# Patient Record
Sex: Female | Born: 1975 | Race: Black or African American | Hispanic: No | Marital: Single | Smoking: Former smoker
Health system: Southern US, Community
[De-identification: ages and names within clinical notes are randomized; demographics above are authoritative.]

## PROBLEM LIST (undated history)

## (undated) DIAGNOSIS — K219 Gastro-esophageal reflux disease without esophagitis: Secondary | ICD-10-CM

## (undated) DIAGNOSIS — D649 Anemia, unspecified: Secondary | ICD-10-CM

## (undated) DIAGNOSIS — F419 Anxiety disorder, unspecified: Secondary | ICD-10-CM

## (undated) HISTORY — PX: UMBILICAL HERNIA REPAIR: SHX196

## (undated) HISTORY — DX: Anemia, unspecified: D64.9

## (undated) HISTORY — DX: Gastro-esophageal reflux disease without esophagitis: K21.9

## (undated) HISTORY — PX: TUBAL LIGATION: SHX77

---

## 2007-06-06 ENCOUNTER — Emergency Department (HOSPITAL_COMMUNITY): Admission: EM | Admit: 2007-06-06 | Discharge: 2007-06-06 | Payer: Self-pay | Admitting: Emergency Medicine

## 2007-10-21 ENCOUNTER — Emergency Department (HOSPITAL_COMMUNITY): Admission: EM | Admit: 2007-10-21 | Discharge: 2007-10-21 | Payer: Self-pay | Admitting: Family Medicine

## 2007-11-06 ENCOUNTER — Emergency Department (HOSPITAL_COMMUNITY): Admission: EM | Admit: 2007-11-06 | Discharge: 2007-11-06 | Payer: Self-pay | Admitting: Emergency Medicine

## 2008-06-22 ENCOUNTER — Emergency Department (HOSPITAL_COMMUNITY): Admission: EM | Admit: 2008-06-22 | Discharge: 2008-06-22 | Payer: Self-pay | Admitting: Family Medicine

## 2008-07-19 ENCOUNTER — Emergency Department (HOSPITAL_COMMUNITY): Admission: EM | Admit: 2008-07-19 | Discharge: 2008-07-19 | Payer: Self-pay | Admitting: Family Medicine

## 2009-03-11 ENCOUNTER — Emergency Department (HOSPITAL_COMMUNITY): Admission: EM | Admit: 2009-03-11 | Discharge: 2009-03-11 | Payer: Self-pay | Admitting: Emergency Medicine

## 2010-05-06 ENCOUNTER — Emergency Department (HOSPITAL_COMMUNITY)
Admission: EM | Admit: 2010-05-06 | Discharge: 2010-05-06 | Payer: Self-pay | Source: Home / Self Care | Admitting: Family Medicine

## 2010-07-22 ENCOUNTER — Inpatient Hospital Stay (INDEPENDENT_AMBULATORY_CARE_PROVIDER_SITE_OTHER)
Admission: RE | Admit: 2010-07-22 | Discharge: 2010-07-22 | Disposition: A | Discharge status: Home / Self Care | Payer: Self-pay | Source: Ambulatory Visit | Attending: Emergency Medicine | Admitting: Emergency Medicine

## 2010-07-22 DIAGNOSIS — H6690 Otitis media, unspecified, unspecified ear: Secondary | ICD-10-CM

## 2010-07-22 DIAGNOSIS — J069 Acute upper respiratory infection, unspecified: Secondary | ICD-10-CM

## 2010-08-14 ENCOUNTER — Inpatient Hospital Stay (INDEPENDENT_AMBULATORY_CARE_PROVIDER_SITE_OTHER)
Admission: RE | Admit: 2010-08-14 | Discharge: 2010-08-14 | Disposition: A | Discharge status: Home / Self Care | Payer: Self-pay | Source: Ambulatory Visit

## 2010-08-14 DIAGNOSIS — H9209 Otalgia, unspecified ear: Secondary | ICD-10-CM

## 2010-08-18 LAB — POCT URINALYSIS DIPSTICK
Glucose, UA: NEGATIVE mg/dL
Ketones, ur: NEGATIVE mg/dL
Nitrite: NEGATIVE
Specific Gravity, Urine: <=1.005 (ref 1.005–1.030)

## 2010-08-18 LAB — GC/CHLAMYDIA PROBE AMP, GENITAL
Chlamydia, DNA Probe: NEGATIVE
GC Probe Amp, Genital: NEGATIVE

## 2010-08-18 LAB — URINE CULTURE
Colony Count: 25000
Culture  Setup Time: 201111302152

## 2010-08-18 LAB — POCT PREGNANCY, URINE: Preg Test, Ur: NEGATIVE

## 2010-08-18 LAB — WET PREP, GENITAL: Yeast Wet Prep HPF POC: NONE SEEN

## 2010-09-10 LAB — URINALYSIS, ROUTINE W REFLEX MICROSCOPIC
Bilirubin Urine: NEGATIVE
Urobilinogen, UA: 1 mg/dL (ref 0.0–1.0)
pH: 7 (ref 5.0–8.0)

## 2010-09-10 LAB — GC/CHLAMYDIA PROBE AMP, GENITAL
Chlamydia, DNA Probe: NEGATIVE
GC Probe Amp, Genital: POSITIVE — AB

## 2010-09-10 LAB — WET PREP, GENITAL
Trich, Wet Prep: NONE SEEN
Yeast Wet Prep HPF POC: NONE SEEN

## 2010-09-10 LAB — URINE MICROSCOPIC-ADD ON

## 2010-10-30 ENCOUNTER — Inpatient Hospital Stay (HOSPITAL_COMMUNITY): Admission: RE | Admit: 2010-10-30 | Payer: Self-pay | Source: Ambulatory Visit

## 2010-10-30 ENCOUNTER — Inpatient Hospital Stay (INDEPENDENT_AMBULATORY_CARE_PROVIDER_SITE_OTHER)
Admission: RE | Admit: 2010-10-30 | Discharge: 2010-10-30 | Disposition: A | Discharge status: Home / Self Care | Payer: Self-pay | Source: Ambulatory Visit | Attending: Family Medicine | Admitting: Family Medicine

## 2010-10-30 DIAGNOSIS — J069 Acute upper respiratory infection, unspecified: Secondary | ICD-10-CM

## 2010-10-30 LAB — POCT I-STAT, CHEM 8
BUN: 8 mg/dL (ref 6–23)
Chloride: 105 mEq/L (ref 96–112)
HCT: 40 % (ref 36.0–46.0)
Hemoglobin: 13.6 g/dL (ref 12.0–15.0)
Potassium: 3.6 mEq/L (ref 3.5–5.1)
TCO2: 25 mmol/L (ref 0–100)

## 2010-11-28 ENCOUNTER — Inpatient Hospital Stay (INDEPENDENT_AMBULATORY_CARE_PROVIDER_SITE_OTHER)
Admission: RE | Admit: 2010-11-28 | Discharge: 2010-11-28 | Disposition: A | Discharge status: Home / Self Care | Payer: Self-pay | Source: Ambulatory Visit | Attending: Family Medicine | Admitting: Family Medicine

## 2010-11-28 DIAGNOSIS — J029 Acute pharyngitis, unspecified: Secondary | ICD-10-CM

## 2010-11-28 DIAGNOSIS — R05 Cough: Secondary | ICD-10-CM

## 2011-08-12 ENCOUNTER — Encounter (HOSPITAL_COMMUNITY): Payer: Self-pay | Admitting: Emergency Medicine

## 2011-08-12 ENCOUNTER — Emergency Department (INDEPENDENT_AMBULATORY_CARE_PROVIDER_SITE_OTHER)
Admission: EM | Admit: 2011-08-12 | Discharge: 2011-08-12 | Disposition: A | Discharge status: Home Health | Payer: Self-pay | Source: Home / Self Care | Attending: Family Medicine | Admitting: Family Medicine

## 2011-08-12 DIAGNOSIS — N760 Acute vaginitis: Secondary | ICD-10-CM

## 2011-08-12 DIAGNOSIS — N390 Urinary tract infection, site not specified: Secondary | ICD-10-CM

## 2011-08-12 LAB — POCT URINALYSIS DIP (DEVICE)
Glucose, UA: NEGATIVE mg/dL
Specific Gravity, Urine: 1.02 (ref 1.005–1.030)

## 2011-08-12 LAB — WET PREP, GENITAL: Yeast Wet Prep HPF POC: NONE SEEN

## 2011-08-12 LAB — POCT PREGNANCY, URINE: Preg Test, Ur: NEGATIVE

## 2011-08-12 MED ORDER — CEFTRIAXONE SODIUM 1 G IJ SOLR
1.0000 g | Freq: Once | INTRAMUSCULAR | Status: AC
  Administered 2011-08-12: 1 g via INTRAMUSCULAR

## 2011-08-12 MED ORDER — METRONIDAZOLE 500 MG PO TABS: 500.0000 mg | ORAL_TABLET | Freq: Two times a day (BID) | ORAL | Status: DC

## 2011-08-12 MED ORDER — CEFTRIAXONE SODIUM 1 G IJ SOLR
INTRAMUSCULAR | Status: AC
  Filled 2011-08-12: qty 10

## 2011-08-12 MED ORDER — AZITHROMYCIN 250 MG PO TABS
1000.0000 mg | ORAL_TABLET | Freq: Once | ORAL | Status: AC
  Administered 2011-08-12: 1000 mg via ORAL

## 2011-08-12 MED ORDER — CEFTRIAXONE SODIUM 250 MG IJ SOLR
INTRAMUSCULAR | Status: AC
  Filled 2011-08-12: qty 250

## 2011-08-12 MED ORDER — AZITHROMYCIN 250 MG PO TABS
ORAL_TABLET | ORAL | Status: AC
  Filled 2011-08-12: qty 4

## 2011-08-12 NOTE — ED Provider Notes (Signed)
History     CSN: [id number]  Arrival date & time 08/12/11  1149   First MD Initiated Contact with Patient 08/12/11 1355      Chief Complaint  Patient presents with  . Vaginal Discharge  . Urinary Tract Infection  . Vaginal Bleeding    (Consider location/radiation/quality/duration/timing/severity/associated sxs/prior treatment) HPI Comments: The patient reports having a vaginal discharge x 3 wks with odor. Has noted some spotting. Last period 3 wks ago. Has had tubes tied. Denies and abd pain. Has noted dysuria and frequency.   The history is provided by the patient.    History reviewed. No pertinent past medical history.  Past Surgical History  Procedure Date  . Tubal ligation     History reviewed. No pertinent family history.  History  Substance Use Topics  . Smoking status: Never Smoker   . Smokeless tobacco: Not on file  . Alcohol Use: Yes    OB History    Grav Para Term Preterm Abortions TAB SAB Ect Mult Living                  Review of Systems  Constitutional: Negative.   HENT: Negative.   Respiratory: Negative.   Cardiovascular: Negative.   Gastrointestinal: Negative.     Allergies  Review of patient's allergies indicates no known allergies.  Home Medications   Current Outpatient Rx  Name Route Sig Dispense Refill  . METRONIDAZOLE 500 MG PO TABS Oral Take 1 tablet (500 mg total) by mouth 2 (two) times daily. 14 tablet 0    BP 111/77  Pulse 90  Temp(Src) 98.9 F (37.2 C) (Oral)  Resp 18  SpO2 100%  LMP 07/22/2011  Physical Exam  Nursing note and vitals reviewed. Constitutional: She appears well-developed. No distress.  Cardiovascular: Normal rate.   Pulmonary/Chest: Effort normal.  Abdominal: There is no tenderness.  Genitourinary:       Pelvic exam with female nursing personal jill assisting reveals no skin or vulvar lesion. Purulent discharge with odor noted. Samples collected.     ED Course  Procedures (including critical care  time)  Labs Reviewed  POCT URINALYSIS DIP (DEVICE) - Abnormal; Notable for the following:    Ketones, ur TRACE (*)    Hgb urine dipstick LARGE (*)    Protein, ur 100 (*)    Leukocytes, UA LARGE (*) Biochemical Testing Only. Please order routine urinalysis from main lab if confirmatory testing is needed.   All other components within normal limits  POCT PREGNANCY, URINE  URINE CULTURE  WET PREP, GENITAL  GC/CHLAMYDIA PROBE AMP, GENITAL   No results found.   1. Vaginitis   2. UTI (lower urinary tract infection)       MDM          Randa Spike, MD 08/12/11 1416

## 2011-08-12 NOTE — ED Notes (Signed)
PT HERE WITH VAG WHITE D/C WITH ODOR,IRRITATION AND BURNING WITH URINATION AND LOWER BACK PAIN THAT STARTED X 3 WEEKS AGO AND ALSO SHE NOTICED VAG SPOTTING WITH WIPING X 1 WEEK.DENIES IRREGULAR PERIODS.LMP 3 WEEKS AGO.

## 2011-08-12 NOTE — Discharge Instructions (Signed)
No intercourse x 10 days. We will contact you with any positive lab results and any new instructions if indicated.  

## 2011-08-13 LAB — URINE CULTURE: Culture: NO GROWTH

## 2011-08-13 LAB — GC/CHLAMYDIA PROBE AMP, GENITAL
Chlamydia, DNA Probe: NEGATIVE
GC Probe Amp, Genital: NEGATIVE

## 2011-08-13 NOTE — ED Notes (Signed)
GC/Chlamydia neg., Wet prep: Few clue cells, many WBC's, Urine culture: No growth. Pt. adequately treated with Flagyl. [name] 08/13/2011

## 2011-11-20 ENCOUNTER — Emergency Department (HOSPITAL_COMMUNITY)
Admission: EM | Admit: 2011-11-20 | Discharge: 2011-11-20 | Disposition: A | Discharge status: Home / Self Care | Payer: Self-pay | Source: Home / Self Care | Attending: Emergency Medicine | Admitting: Emergency Medicine

## 2011-11-20 ENCOUNTER — Encounter (HOSPITAL_COMMUNITY): Payer: Self-pay

## 2011-11-20 DIAGNOSIS — B9689 Other specified bacterial agents as the cause of diseases classified elsewhere: Secondary | ICD-10-CM

## 2011-11-20 DIAGNOSIS — N760 Acute vaginitis: Secondary | ICD-10-CM

## 2011-11-20 DIAGNOSIS — N390 Urinary tract infection, site not specified: Secondary | ICD-10-CM

## 2011-11-20 DIAGNOSIS — A499 Bacterial infection, unspecified: Secondary | ICD-10-CM

## 2011-11-20 LAB — POCT URINALYSIS DIP (DEVICE)
Glucose, UA: NEGATIVE mg/dL
Hgb urine dipstick: NEGATIVE
Nitrite: NEGATIVE
Protein, ur: NEGATIVE mg/dL
Specific Gravity, Urine: 1.02 (ref 1.005–1.030)
Urobilinogen, UA: 0.2 mg/dL (ref 0.0–1.0)

## 2011-11-20 LAB — WET PREP, GENITAL: Yeast Wet Prep HPF POC: NONE SEEN

## 2011-11-20 MED ORDER — CEPHALEXIN 500 MG PO CAPS: 500.0000 mg | ORAL_CAPSULE | Freq: Three times a day (TID) | ORAL | Status: AC

## 2011-11-20 MED ORDER — METRONIDAZOLE 500 MG PO TABS: 500.0000 mg | ORAL_TABLET | Freq: Two times a day (BID) | ORAL | Status: DC

## 2011-11-20 NOTE — ED Provider Notes (Signed)
Chief Complaint  Patient presents with  . Vaginal Discharge    History of Present Illness:   The patient is a 36 year old female who's had a two-week history of a white vaginal discharge, slight vulvar and vaginal irritation, and older. She denies any itching or pain. She's had no pelvic pain. She denies any dysuria but has had some lower back pain for the past 3 weeks and has noticed some pinkish tinged her urine over the past 2 days. She has a history of bacterial vaginosis and urinary tract infections. She denies any fever, chills, nausea, or vomiting. Her last menstrual period was May 30. She is sexually active. She has had a tubal ligation.  Review of Systems:  Other than noted above, the patient denies any of the following symptoms: Systemic:  No fever, chills, sweats, fatigue, or weight loss. GI:  No abdominal pain, nausea, anorexia, vomiting, diarrhea, constipation, melena or hematochezia. GU:  No dysuria, frequency, urgency, hematuria, vaginal discharge, itching, or abnormal vaginal bleeding. Skin:  No rash or itching.   PMFSH:  Past medical history, family history, social history, meds, and allergies were reviewed.  Physical Exam:   Vital signs:  BP 138/91  Pulse 86  Temp 99 F (37.2 C) (Oral)  Resp 18  SpO2 99%  LMP 11/15/2011 General:  Alert, oriented and in no distress. Lungs:  Breath sounds clear and equal bilaterally.  No wheezes, rales or rhonchi. Heart:  Regular rhythm.  No gallops or murmers. Abdomen:  Soft, flat and non-distended.  No organomegaly or mass.  No tenderness, guarding or rebound.  Bowel sounds normally active. Pelvic exam:  Normal external genitalia. There is a moderate amount of white discharge which was non-malodorous. Cervix was otherwise normal. Uterus was midposition, normal in size and shape, no adnexal masses or tenderness. Skin:  Clear, warm and dry.  Labs:   Results for orders placed during the hospital encounter of 11/20/11  POCT URINALYSIS  DIP (DEVICE)      Component Value Range   Glucose, UA NEGATIVE  NEGATIVE mg/dL   Bilirubin Urine NEGATIVE  NEGATIVE   Ketones, ur NEGATIVE  NEGATIVE mg/dL   Specific Gravity, Urine 1.020  1.005 - 1.030   Hgb urine dipstick NEGATIVE  NEGATIVE   pH 8.5 (*) 5.0 - 8.0   Protein, ur NEGATIVE  NEGATIVE mg/dL   Urobilinogen, UA 0.2  0.0 - 1.0 mg/dL   Nitrite NEGATIVE  NEGATIVE   Leukocytes, UA NEGATIVE  NEGATIVE  POCT PREGNANCY, URINE      Component Value Range   Preg Test, Ur NEGATIVE  NEGATIVE  WET PREP, GENITAL      Component Value Range   Yeast Wet Prep HPF POC NONE SEEN  NONE SEEN   Trich, Wet Prep NONE SEEN  NONE SEEN   Clue Cells Wet Prep HPF POC MODERATE (*) NONE SEEN   WBC, Wet Prep HPF POC FEW (*) NONE SEEN    Other Labs Obtained at Urgent Care Center:  A urine culture was obtained as well as GC and Chlamydia DNA probes.  Results are pending at this time and we will call about any positive results.  Assessment:  The primary encounter diagnosis was Bacterial vaginosis. A diagnosis of UTI (lower urinary tract infection) was also pertinent to this visit.  Plan:   1.  The following meds were prescribed:   New Prescriptions   CEPHALEXIN (KEFLEX) 500 MG CAPSULE    Take 1 capsule (500 mg total) by mouth 3 (three) times  daily.   METRONIDAZOLE (FLAGYL) 500 MG TABLET    Take 1 tablet (500 mg total) by mouth 2 (two) times daily.   2.  The patient was instructed in symptomatic care and handouts were given. 3.  The patient was told to return if becoming worse in any way, if no better in 3 or 4 days, and given some red flag symptoms that would indicate earlier return.    Reuben Likes, MD 11/20/11 [phone]

## 2011-11-20 NOTE — ED Notes (Signed)
Pt has vaginal discharge and fishy smell for two weeks, she was here one month ago with BV

## 2011-11-20 NOTE — Discharge Instructions (Signed)
To restore the normal balance of "good bacteria" in your system.  Take a probiotic once daily.  These can be gotten over the counter at the drug store without a prescription and come under various brand names such as Culturelle, Align, Florastore, and Phillips.  The best thing to do is to ask your pharmacist to recommend a good probiotic that is not too expensive.    Bacterial Vaginosis Bacterial vaginosis (BV) is a vaginal infection where the normal balance of bacteria in the vagina is disrupted. The normal balance is then replaced by an overgrowth of certain bacteria. There are several different kinds of bacteria that can cause BV. BV is the most common vaginal infection in women of childbearing age. CAUSES   The cause of BV is not fully understood. BV develops when there is an increase or imbalance of harmful bacteria.   Some activities or behaviors can upset the normal balance of bacteria in the vagina and put women at increased risk including:   Having a new sex partner or multiple sex partners.   Douching.   Using an intrauterine device (IUD) for contraception.   It is not clear what role sexual activity plays in the development of BV. However, women that have never had sexual intercourse are rarely infected with BV.  Women do not get BV from toilet seats, bedding, swimming pools or from touching objects around them.  SYMPTOMS   Grey vaginal discharge.   A fish-like odor with discharge, especially after sexual intercourse.   Itching or burning of the vagina and vulva.   Burning or pain with urination.   Some women have no signs or symptoms at all.  DIAGNOSIS  Your caregiver must examine the vagina for signs of BV. Your caregiver will perform lab tests and look at the sample of vaginal fluid through a microscope. They will look for bacteria and abnormal cells (clue cells), a pH test higher than 4.5, and a positive amine test all associated with BV.  RISKS AND COMPLICATIONS    Pelvic inflammatory disease (PID).   Infections following gynecology surgery.   Developing HIV.   Developing herpes virus.  TREATMENT  Sometimes BV will clear up without treatment. However, all women with symptoms of BV should be treated to avoid complications, especially if gynecology surgery is planned. Female partners generally do not need to be treated. However, BV may spread between female sex partners so treatment is helpful in preventing a recurrence of BV.   BV may be treated with antibiotics. The antibiotics come in either pill or vaginal cream forms. Either can be used with nonpregnant or pregnant women, but the recommended dosages differ. These antibiotics are not harmful to the baby.   BV can recur after treatment. If this happens, a second round of antibiotics will often be prescribed.   Treatment is important for pregnant women. If not treated, BV can cause a premature delivery, especially for a pregnant woman who had a premature birth in the past. All pregnant women who have symptoms of BV should be checked and treated.   For chronic reoccurrence of BV, treatment with a type of prescribed gel vaginally twice a week is helpful.  HOME CARE INSTRUCTIONS   Finish all medication as directed by your caregiver.   Do not have sex until treatment is completed.   Tell your sexual partner that you have a vaginal infection. They should see their caregiver and be treated if they have problems, such as a mild rash or itching.     Practice safe sex. Use condoms. Only have 1 sex partner.  PREVENTION  Basic prevention steps can help reduce the risk of upsetting the natural balance of bacteria in the vagina and developing BV:  Do not have sexual intercourse (be abstinent).   Do not douche.   Use all of the medicine prescribed for treatment of BV, even if the signs and symptoms go away.   Tell your sex partner if you have BV. That way, they can be treated, if needed, to prevent  reoccurrence.  SEEK MEDICAL CARE IF:   Your symptoms are not improving after 3 days of treatment.   You have increased discharge, pain, or fever.  MAKE SURE YOU:   Understand these instructions.   Will watch your condition.   Will get help right away if you are not doing well or get worse.  FOR MORE INFORMATION  Division of STD Prevention (DSTDP), Centers for Disease Control and Prevention: www.cdc.gov/std American [employer] Health Association (ASHA): www.ashastd.org  Document Released: 05/24/2005 Document Revised: 05/13/2011 Document Reviewed: 11/14/2008 ExitCare Patient Information 2012 [employer]. Urinary Tract Infection Infections of the urinary tract can start in several places. A bladder infection (cystitis), a kidney infection (pyelonephritis), and a prostate infection (prostatitis) are different types of urinary tract infections (UTIs). They usually get better if treated with medicines (antibiotics) that kill germs. Take all the medicine until it is gone. You or your child may feel better in a few days, but TAKE ALL MEDICINE or the infection may not respond and may become more difficult to treat. HOME CARE INSTRUCTIONS   Drink enough water and fluids to keep the urine clear or pale yellow. Cranberry juice is especially recommended, in addition to large amounts of water.   Avoid caffeine, tea, and carbonated beverages. They tend to irritate the bladder.   Alcohol may irritate the prostate.   Only take over-the-counter or prescription medicines for pain, discomfort, or fever as directed by your caregiver.  To prevent further infections:  Empty the bladder often. Avoid holding urine for long periods of time.   After a bowel movement, women should cleanse from front to back. Use each tissue only once.   Empty the bladder before and after sexual intercourse.  FINDING OUT THE RESULTS OF YOUR TEST Not all test results are available during your visit. If your or your child's  test results are not back during the visit, make an appointment with your caregiver to find out the results. Do not assume everything is normal if you have not heard from your caregiver or the medical facility. It is important for you to follow up on all test results. SEEK MEDICAL CARE IF:   There is back pain.   Your baby is older than 3 months with a rectal temperature of 100.5 F (38.1 C) or higher for more than 1 day.   Your or your child's problems (symptoms) are no better in 3 days. Return sooner if you or your child is getting worse.  SEEK IMMEDIATE MEDICAL CARE IF:   There is severe back pain or lower abdominal pain.   You or your child develops chills.   You have a fever.   Your baby is older than 3 months with a rectal temperature of 102 F (38.9 C) or higher.   Your baby is 3 months old or younger with a rectal temperature of 100.4 F (38 C) or higher.   There is nausea or vomiting.   There is continued burning or discomfort   with urination.  MAKE SURE YOU:   Understand these instructions.   Will watch your condition.   Will get help right away if you are not doing well or get worse.  Document Released: 03/03/2005 Document Revised: 05/13/2011 Document Reviewed: 10/06/2006 ExitCare Patient Information 2012 [employer]. 

## 2011-11-21 LAB — URINE CULTURE: Colony Count: 95000

## 2011-11-22 LAB — GC/CHLAMYDIA PROBE AMP, GENITAL
Chlamydia, DNA Probe: NEGATIVE
GC Probe Amp, Genital: NEGATIVE

## 2011-11-22 NOTE — ED Notes (Signed)
GC/Chlamydia neg., Wet prep: Mod. clue cells, few WBC's, Urine culture: 95,000 colonies Multiple bacterial types none predominant. Pt. adequately treated with Flagyl. [name] 11/22/2011

## 2012-12-21 ENCOUNTER — Encounter (HOSPITAL_COMMUNITY): Payer: Self-pay | Admitting: Emergency Medicine

## 2012-12-21 ENCOUNTER — Other Ambulatory Visit (HOSPITAL_COMMUNITY)
Admission: RE | Admit: 2012-12-21 | Discharge: 2012-12-21 | Disposition: A | Discharge status: Home / Self Care | Payer: BC Managed Care – PPO | Source: Ambulatory Visit | Attending: Family Medicine | Admitting: Family Medicine

## 2012-12-21 ENCOUNTER — Emergency Department (HOSPITAL_COMMUNITY)
Admission: EM | Admit: 2012-12-21 | Discharge: 2012-12-21 | Disposition: A | Discharge status: Home / Self Care | Payer: BC Managed Care – PPO | Source: Home / Self Care

## 2012-12-21 DIAGNOSIS — Z113 Encounter for screening for infections with a predominantly sexual mode of transmission: Secondary | ICD-10-CM | POA: Insufficient documentation

## 2012-12-21 DIAGNOSIS — N760 Acute vaginitis: Secondary | ICD-10-CM | POA: Insufficient documentation

## 2012-12-21 LAB — POCT URINALYSIS DIP (DEVICE)
Ketones, ur: NEGATIVE mg/dL
Protein, ur: NEGATIVE mg/dL
Specific Gravity, Urine: >=1.03 (ref 1.005–1.030)
Urobilinogen, UA: 0.2 mg/dL (ref 0.0–1.0)
pH: 5.5 (ref 5.0–8.0)

## 2012-12-21 LAB — POCT PREGNANCY, URINE
Preg Test, Ur: NEGATIVE
Preg Test, Ur: NEGATIVE

## 2012-12-21 MED ORDER — METRONIDAZOLE 500 MG PO TABS: 500.0000 mg | ORAL_TABLET | Freq: Two times a day (BID) | ORAL | Status: DC

## 2012-12-21 NOTE — ED Provider Notes (Signed)
History    CSN: [id number] Arrival date & time 12/21/12  1144  First MD Initiated Contact with Patient 12/21/12 1312     Chief Complaint  Patient presents with  . Vaginal Discharge   (Consider location/radiation/quality/duration/timing/severity/associated sxs/prior Treatment) HPI Comments: 37 year old female presents complaining of vaginal itching and irritation. This is been going on for the past 4-5 days. She also has noticed a thick white discharge with a foul odor. She denies any fever, chills, abdominal pain, pelvic pain. She has had BV multiple times in the past and she believes this is what she has today. She would also like to know what she might do in the future to prevent BV infections   No past medical history on file. Past Surgical History  Procedure Laterality Date  . Tubal ligation     No family history on file. History  Substance Use Topics  . Smoking status: Never Smoker   . Smokeless tobacco: Former Neurosurgeon  . Alcohol Use: No   OB History   Grav Para Term Preterm Abortions TAB SAB Ect Mult Living                 Review of Systems  Constitutional: Negative for fever and chills.  Eyes: Negative for visual disturbance.  Respiratory: Negative for cough and shortness of breath.   Cardiovascular: Negative for chest pain, palpitations and leg swelling.  Gastrointestinal: Negative for nausea, vomiting and abdominal pain.  Endocrine: Negative for polydipsia and polyuria.  Genitourinary: Positive for vaginal discharge. Negative for dysuria, urgency, frequency, flank pain, vaginal bleeding, vaginal pain and pelvic pain.  Musculoskeletal: Negative for myalgias and arthralgias.  Skin: Negative for rash.  Neurological: Negative for dizziness, weakness and light-headedness.    Allergies  Review of patient's allergies indicates no known allergies.  Home Medications   Current Outpatient Rx  Name  Route  Sig  Dispense  Refill  . metroNIDAZOLE (FLAGYL) 500 MG tablet  Oral   Take 1 tablet (500 mg total) by mouth 2 (two) times daily.   14 tablet   0    BP 126/83  Pulse 78  Temp(Src) 98.1 F (36.7 C) (Oral)  Resp 18  SpO2 99% Physical Exam  Nursing note and vitals reviewed. Constitutional: She is oriented to person, place, and time. Vital signs are normal. She appears well-developed and well-nourished. No distress.  HENT:  Head: Atraumatic.  Eyes: EOM are normal. Pupils are equal, round, and reactive to light.  Cardiovascular: Normal rate, regular rhythm and normal heart sounds.  Exam reveals no gallop and no friction rub.   No murmur heard. Pulmonary/Chest: Effort normal and breath sounds normal. No respiratory distress. She has no wheezes. She has no rales.  Abdominal: Soft. Bowel sounds are normal. She exhibits no distension. There is no tenderness.  Genitourinary: Cervix exhibits no motion tenderness and no discharge. Right adnexum displays no mass, no tenderness and no fullness. Left adnexum displays no mass, no tenderness and no fullness. No tenderness around the vagina. Vaginal discharge (thin, white, frothy, malodorous) found.  Neurological: She is alert and oriented to person, place, and time. She has normal strength.  Skin: Skin is warm and dry. She is not diaphoretic.  Psychiatric: She has a normal mood and affect. Her behavior is normal. Judgment normal.    ED Course  Procedures (including critical care time) Labs Reviewed  POCT URINALYSIS DIP (DEVICE) - Abnormal; Notable for the following:    Hgb urine dipstick TRACE (*)    Leukocytes,  UA TRACE (*)    All other components within normal limits  URINE CULTURE  POCT PREGNANCY, URINE  POCT PREGNANCY, URINE  CERVICOVAGINAL ANCILLARY ONLY   No results found. 1. Vaginitis and vulvovaginitis     MDM  This discharge has the appearance of trichomonas. She has a history of multiple BV infections so we'll treat this with 2 week of metronidazole twice a day. Swabs sent for cytology, she  will be notified of results   Meds ordered this encounter  Medications  . metroNIDAZOLE (FLAGYL) 500 MG tablet    Sig: Take 1 tablet (500 mg total) by mouth 2 (two) times daily.    Dispense:  14 tablet    Refill:  0     Graylon Good, PA-C 12/21/12 1738

## 2012-12-21 NOTE — ED Notes (Signed)
Patient says she thinks she has BV.  Patient says she is irritated.   Patient says she does discharge; thick white discharge.   Denies pain when urinating.

## 2012-12-22 LAB — URINE CULTURE
Colony Count: NO GROWTH
Culture: NO GROWTH

## 2012-12-22 NOTE — ED Notes (Signed)
GC/Chlamydia neg., Affirm: Candida and Trich neg., Gardnerella pos., Urine culture: No growth.  Pt. adequately treated with Flagyl for bacterial vaginosis. [name] 12/22/2012

## 2012-12-22 NOTE — ED Provider Notes (Signed)
Medical screening examination/treatment/procedure(s) were performed by a resident physician or non-physician practitioner and as the supervising physician I was immediately available for consultation/collaboration.  Teion Ballin, MD   Maryela Tapper S Ollen Rao, MD 12/22/12 0807 

## 2013-06-20 ENCOUNTER — Encounter (HOSPITAL_COMMUNITY): Payer: Self-pay | Admitting: Emergency Medicine

## 2013-06-20 ENCOUNTER — Other Ambulatory Visit (HOSPITAL_COMMUNITY)
Admission: RE | Admit: 2013-06-20 | Discharge: 2013-06-20 | Disposition: A | Discharge status: Home / Self Care | Payer: BC Managed Care – PPO | Source: Ambulatory Visit | Attending: Emergency Medicine | Admitting: Emergency Medicine

## 2013-06-20 ENCOUNTER — Emergency Department (HOSPITAL_COMMUNITY)
Admission: EM | Admit: 2013-06-20 | Discharge: 2013-06-20 | Disposition: A | Discharge status: Home / Self Care | Payer: BC Managed Care – PPO | Source: Home / Self Care | Attending: Emergency Medicine | Admitting: Emergency Medicine

## 2013-06-20 DIAGNOSIS — Z113 Encounter for screening for infections with a predominantly sexual mode of transmission: Secondary | ICD-10-CM | POA: Insufficient documentation

## 2013-06-20 DIAGNOSIS — H68009 Unspecified Eustachian salpingitis, unspecified ear: Secondary | ICD-10-CM

## 2013-06-20 DIAGNOSIS — A499 Bacterial infection, unspecified: Secondary | ICD-10-CM

## 2013-06-20 DIAGNOSIS — N760 Acute vaginitis: Secondary | ICD-10-CM

## 2013-06-20 DIAGNOSIS — B9689 Other specified bacterial agents as the cause of diseases classified elsewhere: Secondary | ICD-10-CM

## 2013-06-20 LAB — POCT URINALYSIS DIP (DEVICE)
BILIRUBIN URINE: NEGATIVE
Glucose, UA: NEGATIVE mg/dL
KETONES UR: NEGATIVE mg/dL
Nitrite: NEGATIVE
PH: 6.5 (ref 5.0–8.0)
Protein, ur: NEGATIVE mg/dL
Specific Gravity, Urine: 1.025 (ref 1.005–1.030)
Urobilinogen, UA: 0.2 mg/dL (ref 0.0–1.0)

## 2013-06-20 LAB — POCT PREGNANCY, URINE: Preg Test, Ur: NEGATIVE

## 2013-06-20 MED ORDER — METRONIDAZOLE 500 MG PO TABS: 500.0000 mg | ORAL_TABLET | Freq: Two times a day (BID) | ORAL | Status: DC

## 2013-06-20 MED ORDER — PREDNISONE 20 MG PO TABS: 20.0000 mg | ORAL_TABLET | Freq: Two times a day (BID) | ORAL | Status: DC

## 2013-06-20 MED ORDER — FLUTICASONE PROPIONATE 50 MCG/ACT NA SUSP: 2.0000 | Freq: Every day | NASAL | Status: DC

## 2013-06-20 NOTE — ED Notes (Addendum)
1-concerned about bilateral ear "discomfort", denies cough or runny nose.   2-patient reports history of bv and thinks it has returned: vaginal discharge, odor, and irritation no abdominal or back pain

## 2013-06-20 NOTE — ED Notes (Signed)
Pelvic supplies at bedside. 

## 2013-06-20 NOTE — ED Provider Notes (Signed)
Chief Complaint:   Chief Complaint  Patient presents with  . Otalgia  . Vaginal Discharge    History of Present Illness:   Diamond Hansen is a 38 year old female who comes in today with discomfort in both ears and vaginal discharge.  1. Ear pain: The patient has had a one-week history of pain and congestion in both ears. She denies any drainage. She denies nasal congestion, rhinorrhea, postnasal drip, fever, or sore throat. She has not had ear problems in the past.  2. Vaginal discharge: The patient has had a two-week history of a white, malodorous, itchy vaginal discharge. She denies any pain in the pelvic area or the lower back. She's had no fever, chills, nausea, or vomiting. No urinary symptoms. Her menses have been regular. Last menstrual period was December 26. She has had bacterial vaginosis in the past. She is taking probiotics for prevention although does not use them consistently.  Review of Systems:  Other than noted above, the patient denies any of the following symptoms: Systemic:  No fever, chills, sweats, or weight loss. GI:  No abdominal pain, nausea, anorexia, vomiting, diarrhea, constipation, melena or hematochezia. GU:  No dysuria, frequency, urgency, hematuria, vaginal discharge, itching, or abnormal vaginal bleeding. Skin:  No rash or itching.  [place]:  Past medical history, family history, social history, meds, and allergies were reviewed.   Physical Exam:   Vital signs:  BP 122/76  Pulse 97  Temp(Src) 98.7 F (37.1 C) (Oral)  Resp 12  SpO2 100%  LMP 06/06/2013 General:  Alert, oriented and in no distress. ENT: TMs and canals were completely normal. There was no inflammation or fluid. Nasal mucosa was slightly congested. The pharynx was not erythematous but she did have some mucoid postnasal drip. Lungs:  Breath sounds clear and equal bilaterally.  No wheezes, rales or rhonchi. Heart:  Regular rhythm.  No gallops or murmers. Abdomen:  Soft, flat and non-distended.   No organomegaly or mass.  No tenderness, guarding or rebound.  Bowel sounds normally active. Pelvic exam:  Normal external genitalia, vaginal mucosa was normal, there was a scant, malodorous, white to yellow discharge, no pain on cervical motion, uterus was anterior and normal in size and shape and nontender. No adnexal masses or tenderness. DNA probes for gonorrhea, Chlamydia, Trichomonas, Gardnerella, Candida were obtained. Skin:  Clear, warm and dry.  Labs:   Results for orders placed during the hospital encounter of 06/20/13  POCT URINALYSIS DIP (DEVICE)      Result Value Range   Glucose, UA NEGATIVE  NEGATIVE mg/dL   Bilirubin Urine NEGATIVE  NEGATIVE   Ketones, ur NEGATIVE  NEGATIVE mg/dL   Specific Gravity, Urine 1.025  1.005 - 1.030   Hgb urine dipstick TRACE (*) NEGATIVE   pH 6.5  5.0 - 8.0   Protein, ur NEGATIVE  NEGATIVE mg/dL   Urobilinogen, UA 0.2  0.0 - 1.0 mg/dL   Nitrite NEGATIVE  NEGATIVE   Leukocytes, UA SMALL (*) NEGATIVE  POCT PREGNANCY, URINE      Result Value Range   Preg Test, Ur NEGATIVE  NEGATIVE    Assessment:  The primary encounter diagnosis was Bacterial vaginosis. A diagnosis of Eustachian salpingitis was also pertinent to this visit.  Plan:   1.  Meds:  The following meds were prescribed:   Discharge Medication List as of 06/20/2013 11:11 AM    START taking these medications   Details  fluticasone (FLONASE) 50 MCG/ACT nasal spray Place 2 sprays into both nostrils daily.,  Starting 06/20/2013, Until Discontinued, Normal    predniSONE (DELTASONE) 20 MG tablet Take 1 tablet (20 mg total) by mouth 2 (two) times daily., Starting 06/20/2013, Until Discontinued, Normal       Suggested that she take over-the-counter Zyrtec-D for the eustachian salpingitis.  2.  Patient Education/Counseling:  The patient was given appropriate handouts, self care instructions, and instructed in symptomatic relief.  Suggested she take her probiotics regularly, try a different  probiotic, or try doubling the dose.  3.  Follow up:  The patient was told to follow up if no better in 3 to 4 days, if becoming worse in any way, and given some red flag symptoms such as pelvic pain or fever which would prompt immediate return.  Follow up here if necessary.  Harden Mo, MD 06/20/13 [phone]

## 2013-06-20 NOTE — Discharge Instructions (Signed)
Take Zyrtec D for ear pain and congestion.  Take Probiotic regularly or try a different brand or take a double dose.    Bacterial Vaginosis Bacterial vaginosis is a vaginal infection that occurs when the normal balance of bacteria in the vagina is disrupted. It results from an overgrowth of certain bacteria. This is the most common vaginal infection in women of childbearing age. Treatment is important to prevent complications, especially in pregnant women, as it can cause a premature delivery. CAUSES  Bacterial vaginosis is caused by an increase in harmful bacteria that are normally present in smaller amounts in the vagina. Several different kinds of bacteria can cause bacterial vaginosis. However, the reason that the condition develops is not fully understood. RISK FACTORS Certain activities or behaviors can put you at an increased risk of developing bacterial vaginosis, including:  Having a new sex partner or multiple sex partners.  Douching.  Using an intrauterine device (IUD) for contraception. Women do not get bacterial vaginosis from toilet seats, bedding, swimming pools, or contact with objects around them. SIGNS AND SYMPTOMS  Some women with bacterial vaginosis have no signs or symptoms. Common symptoms include:  Grey vaginal discharge.  A fishlike odor with discharge, especially after sexual intercourse.  Itching or burning of the vagina and vulva.  Burning or pain with urination. DIAGNOSIS  Your health care provider will take a medical history and examine the vagina for signs of bacterial vaginosis. A sample of vaginal fluid may be taken. Your health care provider will look at this sample under a microscope to check for bacteria and abnormal cells. A vaginal pH test may also be done.  TREATMENT  Bacterial vaginosis may be treated with antibiotic medicines. These may be given in the form of a pill or a vaginal cream. A second round of antibiotics may be prescribed if the  condition comes back after treatment.  HOME CARE INSTRUCTIONS   Only take over-the-counter or prescription medicines as directed by your health care provider.  If antibiotic medicine was prescribed, take it as directed. Make sure you finish it even if you start to feel better.  Do not have sex until treatment is completed.  Tell all sexual partners that you have a vaginal infection. They should see their health care provider and be treated if they have problems, such as a mild rash or itching.  Practice safe sex by using condoms and only having one sex partner. SEEK MEDICAL CARE IF:   Your symptoms are not improving after 3 days of treatment.  You have increased discharge or pain.  You have a fever. MAKE SURE YOU:   Understand these instructions.  Will watch your condition.  Will get help right away if you are not doing well or get worse. FOR MORE INFORMATION  Centers for Disease Control and Prevention, Division of STD Prevention: [employer]raud[employer] American Sexual Health Association (ASHA): www.ashastd.org  Document Released: 05/24/2005 Document Revised: 03/14/2013 Document Reviewed: 01/03/2013 [facility] Patient Information 2014 [place].

## 2013-06-21 NOTE — ED Notes (Signed)
GC/Chlamydia neg., Affirm: Candida and Trich neg., Gardnerella pos.  Pt. adequately treated with Flagyl. Roselyn Meier 06/21/2013

## 2013-10-31 ENCOUNTER — Emergency Department (INDEPENDENT_AMBULATORY_CARE_PROVIDER_SITE_OTHER)
Admission: EM | Admit: 2013-10-31 | Discharge: 2013-10-31 | Disposition: A | Discharge status: Home / Self Care | Payer: BC Managed Care – PPO | Source: Home / Self Care | Attending: Family Medicine | Admitting: Family Medicine

## 2013-10-31 ENCOUNTER — Encounter (HOSPITAL_COMMUNITY): Payer: Self-pay | Admitting: Emergency Medicine

## 2013-10-31 DIAGNOSIS — K2100 Gastro-esophageal reflux disease with esophagitis, without bleeding: Secondary | ICD-10-CM

## 2013-10-31 MED ORDER — GI COCKTAIL ~~LOC~~
ORAL | Status: AC
  Filled 2013-10-31: qty 30

## 2013-10-31 MED ORDER — GI COCKTAIL ~~LOC~~
30.0000 mL | Freq: Once | ORAL | Status: AC
  Administered 2013-10-31: 30 mL via ORAL

## 2013-10-31 MED ORDER — LANSOPRAZOLE 30 MG PO CPDR: 30.0000 mg | DELAYED_RELEASE_CAPSULE | Freq: Every day | ORAL | Status: DC

## 2013-10-31 NOTE — ED Provider Notes (Signed)
CSN: [id number]     Arrival date & time 10/31/13  1153 History   First MD Initiated Contact with Patient 10/31/13 1345     Chief Complaint  Patient presents with  . Abdominal Pain  . Shortness of Breath   (Consider location/radiation/quality/duration/timing/severity/associated sxs/prior Treatment) Patient is a 38 y.o. female presenting with abdominal pain and shortness of breath. The history is provided by the patient. No language interpreter was used.  Abdominal Pain Pain location:  Epigastric Pain quality: aching   Pain radiates to:  Does not radiate Pain severity:  Moderate Onset quality:  Gradual Duration:  2 days Timing:  Constant Progression:  Worsening Chronicity:  New Context: not recent illness   Relieved by:  Nothing Worsened by:  Nothing tried Ineffective treatments:  None tried Associated symptoms: shortness of breath   Shortness of Breath Associated symptoms: abdominal pain   Pt reports she has frequent reflux.   Pt complains of pain in the upper left abdomen and chest  History reviewed. No pertinent past medical history. Past Surgical History  Procedure Laterality Date  . Tubal ligation     No family history on file. History  Substance Use Topics  . Smoking status: Never Smoker   . Smokeless tobacco: Former Systems developer  . Alcohol Use: No   OB History   Grav Para Term Preterm Abortions TAB SAB Ect Mult Living                 Review of Systems  Respiratory: Positive for shortness of breath.   Gastrointestinal: Positive for abdominal pain.  All other systems reviewed and are negative.   Allergies  Review of patient's allergies indicates no known allergies.  Home Medications   Prior to Admission medications   Medication Sig Start Date End Date Taking? Authorizing Provider  fluticasone (FLONASE) 50 MCG/ACT nasal spray Place 2 sprays into both nostrils daily. 06/20/13   Harden Mo, MD  metroNIDAZOLE (FLAGYL) 500 MG tablet Take 1 tablet (500 mg total) by  mouth 2 (two) times daily. 12/21/12   Freeman Caldron Baker, PA-C  metroNIDAZOLE (FLAGYL) 500 MG tablet Take 1 tablet (500 mg total) by mouth 2 (two) times daily. 06/20/13   Harden Mo, MD  predniSONE (DELTASONE) 20 MG tablet Take 1 tablet (20 mg total) by mouth 2 (two) times daily. 06/20/13   Harden Mo, MD   BP 137/85  Pulse 76  Temp(Src) 100 F (37.8 C) (Oral)  Resp 19  SpO2 100% Physical Exam  Nursing note and vitals reviewed. Constitutional: She is oriented to person, place, and time. She appears well-developed and well-nourished.  HENT:  Head: Normocephalic.  Eyes: EOM are normal.  Neck: Normal range of motion.  Cardiovascular: Normal rate and regular rhythm.   Pulmonary/Chest: Effort normal and breath sounds normal.  Abdominal: Soft. She exhibits no distension.  Musculoskeletal: Normal range of motion.  Neurological: She is alert and oriented to person, place, and time.  Psychiatric: She has a normal mood and affect.    ED Course  Procedures (including critical care time) Labs Review Labs Reviewed - No data to display  Imaging Review No results found. EKG normal sinus 69 normal ekg  MDM   1. Reflux esophagitis    Pt feels better after Gi cocktail rx for prevacid  Fransico Meadow, PA-C 10/31/13 1450   Medical screening examination/treatment/procedure(s) were performed by a resident physician or non-physician practitioner and as the supervising physician I was immediately available for consultation/collaboration.  Ellard Artis  Georgina Snell, MD    Gregor Hams, MD 11/01/13 [phone]

## 2013-10-31 NOTE — ED Notes (Signed)
Patient reports epigastric pain that travels around left side of torso.  This episode has been present for one week.  Sob or difficulty getting a deep breath has been for 2 days.  She has had pain before and told it was reflex, but has never had sob-this is different.  Intermittent nausea, no vomiting

## 2013-10-31 NOTE — Discharge Instructions (Signed)
Gastroesophageal Reflux Disease, Adult  Gastroesophageal reflux disease (GERD) happens when acid from your stomach flows up into the esophagus. When acid comes in contact with the esophagus, the acid causes soreness (inflammation) in the esophagus. Over time, GERD may create small holes (ulcers) in the lining of the esophagus.  CAUSES   · Increased body weight. This puts pressure on the stomach, making acid rise from the stomach into the esophagus.  · Smoking. This increases acid production in the stomach.  · Drinking alcohol. This causes decreased pressure in the lower esophageal sphincter (valve or ring of muscle between the esophagus and stomach), allowing acid from the stomach into the esophagus.  · Late evening meals and a full stomach. This increases pressure and acid production in the stomach.  · A malformed lower esophageal sphincter.  Sometimes, no cause is found.  SYMPTOMS   · Burning pain in the lower part of the mid-chest behind the breastbone and in the mid-stomach area. This may occur twice a week or more often.  · Trouble swallowing.  · Sore throat.  · Dry cough.  · Asthma-like symptoms including chest tightness, shortness of breath, or wheezing.  DIAGNOSIS   Your caregiver may be able to diagnose GERD based on your symptoms. In some cases, X-rays and other tests may be done to check for complications or to check the condition of your stomach and esophagus.  TREATMENT   Your caregiver may recommend over-the-counter or prescription medicines to help decrease acid production. Ask your caregiver before starting or adding any new medicines.   HOME CARE INSTRUCTIONS   · Change the factors that you can control. Ask your caregiver for guidance concerning weight loss, quitting smoking, and alcohol consumption.  · Avoid foods and drinks that make your symptoms worse, such as:  · Caffeine or alcoholic drinks.  · Chocolate.  · Peppermint or mint flavorings.  · Garlic and onions.  · Spicy foods.  · Citrus fruits,  such as oranges, lemons, or limes.  · Tomato-based foods such as sauce, chili, salsa, and pizza.  · Fried and fatty foods.  · Avoid lying down for the 3 hours prior to your bedtime or prior to taking a nap.  · Eat small, frequent meals instead of large meals.  · Wear loose-fitting clothing. Do not wear anything tight around your waist that causes pressure on your stomach.  · Raise the head of your bed 6 to 8 inches with wood blocks to help you sleep. Extra pillows will not help.  · Only take over-the-counter or prescription medicines for pain, discomfort, or fever as directed by your caregiver.  · Do not take aspirin, ibuprofen, or other nonsteroidal anti-inflammatory drugs (NSAIDs).  SEEK IMMEDIATE MEDICAL CARE IF:   · You have pain in your arms, neck, jaw, teeth, or back.  · Your pain increases or changes in intensity or duration.  · You develop nausea, vomiting, or sweating (diaphoresis).  · You develop shortness of breath, or you faint.  · Your vomit is green, yellow, black, or looks like coffee grounds or blood.  · Your stool is red, bloody, or black.  These symptoms could be signs of other problems, such as heart disease, gastric bleeding, or esophageal bleeding.  MAKE SURE YOU:   · Understand these instructions.  · Will watch your condition.  · Will get help right away if you are not doing well or get worse.  Document Released: 03/03/2005 Document Revised: 08/16/2011 Document Reviewed: 12/11/2010  [employer]® Patient   Information ©2014 [employer].

## 2014-01-08 ENCOUNTER — Ambulatory Visit (INDEPENDENT_AMBULATORY_CARE_PROVIDER_SITE_OTHER): Payer: BC Managed Care – PPO | Admitting: Family Medicine

## 2014-01-08 ENCOUNTER — Encounter: Payer: Self-pay | Admitting: Family Medicine

## 2014-01-08 VITALS — BP 110/82 | HR 73 | Temp 98.9°F | Ht 67.5 in | Wt 195.0 lb

## 2014-01-08 DIAGNOSIS — Z7689 Persons encountering health services in other specified circumstances: Secondary | ICD-10-CM

## 2014-01-08 DIAGNOSIS — K219 Gastro-esophageal reflux disease without esophagitis: Secondary | ICD-10-CM

## 2014-01-08 DIAGNOSIS — B9689 Other specified bacterial agents as the cause of diseases classified elsewhere: Secondary | ICD-10-CM

## 2014-01-08 DIAGNOSIS — Z7189 Other specified counseling: Secondary | ICD-10-CM

## 2014-01-08 DIAGNOSIS — E669 Obesity, unspecified: Secondary | ICD-10-CM

## 2014-01-08 DIAGNOSIS — A499 Bacterial infection, unspecified: Secondary | ICD-10-CM

## 2014-01-08 DIAGNOSIS — N760 Acute vaginitis: Secondary | ICD-10-CM

## 2014-01-08 NOTE — Progress Notes (Signed)
Pre visit review using our clinic review tool, if applicable. No additional management support is needed unless otherwise documented below in the visit note. 

## 2014-01-08 NOTE — Patient Instructions (Addendum)
-  take prilosec 20mg  daily  -adhere to reflux diet and continue exercise  We recommend the following healthy lifestyle measures: - eat a healthy diet consisting of lots of vegetables, fruits, beans, nuts, seeds, healthy meats such as white chicken and fish and whole grains.  - avoid fried foods, fast food, processed foods, sodas, red meet and other fattening foods.  - get a least 150 minutes of aerobic exercise per week.   Follow up for physical in 1-2 months

## 2014-01-08 NOTE — Progress Notes (Signed)
. No chief complaint on file.   HPI:  Diamond Hansen is here to establish care.  Last PCP and physical: [facility] she thinks - cant remember date, had normal pap  Has the following chronic problems and concerns today:  Patient Active Problem List   Diagnosis Date Noted  . Bacterial vaginitis, recurrent 01/08/2014  . GERD (gastroesophageal reflux disease) 01/08/2014   GERD: -bad for many years, get symptoms a few times per week to month -prescribed prevacid by urgent care but not taking -can't drink tea, cant do spicy foods -still has heartburn intermittently, worse at night, gets acid in throat -taking OTC medication intermittently -denies: dysphagia, cough, weight loss, vomiting, diarrhea  OBESITY: -not exercising, trying to work on diet -wonders about her bmi   ROS negative for unless reported above: fevers, unintentional weight loss, hearing or vision loss, chest pain, palpitations, struggling to breath, hemoptysis, melena, hematochezia, hematuria, falls, loc, si, thoughts of self harm  Past Medical History  Diagnosis Date  . GERD (gastroesophageal reflux disease)     Family History  Problem Relation Age of Onset  . Hypertension Father   . Diabetes Brother     History   Social History  . Marital Status: Single    Spouse Name: N/A    Number of Children: N/A  . Years of Education: N/A   Social History Main Topics  . Smoking status: Never Smoker   . Smokeless tobacco: Former Systems developer     Comment: quit in 2011, smoke on and off for 8 years  . Alcohol Use: No  . Drug Use: No  . Sexual Activity: None   Other Topics Concern  . None   Social History Narrative   Work or School: works at [employer] Situation: lives with 3 daughter (82, 81, 2)      Spiritual Beliefs: none      Lifestyle: walks a few days per week; diet is so so             No current outpatient prescriptions on file.  EXAMDanley Danker Vitals:   01/08/14 1433   BP: 110/82  Pulse: 73  Temp: 98.9 F (37.2 C)   Body mass index is 30.07 kg/(m^2).  Body mass index is 30.07 kg/(m^2).  GENERAL: vitals reviewed and listed above, alert, oriented, appears well hydrated and in no acute distress  HEENT: atraumatic, conjunttiva clear, no obvious abnormalities on inspection of external nose and ears  NECK: no obvious masses on inspection  LUNGS: clear to auscultation bilaterally, no wheezes, rales or rhonchi, good air movement  CV: HRRR, no peripheral edema  ABD: soft, NTTP  MS: moves all extremities without noticeable abnormality  PSYCH: pleasant and cooperative, no obvious depression or anxiety  ASSESSMENT AND PLAN:  Discussed the following assessment and plan:  Gastroesophageal reflux disease, esophagitis presence not specified  Encounter to establish care  Bacterial vaginitis, recurrent  Obesity, unspecified  -We reviewed the PMH, PSH, FH, SH, Meds and Allergies. -We provided refills for any medications we will prescribe as needed. -We addressed current concerns per orders and patient instructions. -We have asked for records for pertinent exams, studies, vaccines and notes from previous providers. -We have advised patient to follow up per instructions below.   -Patient advised to return or notify a doctor immediately if symptoms worsen or persist or new concerns arise.  Patient Instructions  -take prilosec 20mg  daily  -adhere to reflux diet and  continue exercise  We recommend the following healthy lifestyle measures: - eat a healthy diet consisting of lots of vegetables, fruits, beans, nuts, seeds, healthy meats such as white chicken and fish and whole grains.  - avoid fried foods, fast food, processed foods, sodas, red meet and other fattening foods.  - get a least 150 minutes of aerobic exercise per week.   Follow up for physical in 1-2 months     KIM, HANNAH R.   bbb

## 2014-03-13 ENCOUNTER — Encounter: Payer: BC Managed Care – PPO | Admitting: Family Medicine

## 2014-03-13 DIAGNOSIS — Z0289 Encounter for other administrative examinations: Secondary | ICD-10-CM

## 2014-03-13 NOTE — Progress Notes (Signed)
error    This encounter was created in error - please disregard.

## 2014-04-02 ENCOUNTER — Ambulatory Visit (INDEPENDENT_AMBULATORY_CARE_PROVIDER_SITE_OTHER): Payer: BC Managed Care – PPO | Admitting: Family Medicine

## 2014-04-02 ENCOUNTER — Other Ambulatory Visit (HOSPITAL_COMMUNITY)
Admission: RE | Admit: 2014-04-02 | Discharge: 2014-04-02 | Disposition: A | Discharge status: Home / Self Care | Payer: BC Managed Care – PPO | Source: Ambulatory Visit | Attending: Family Medicine | Admitting: Family Medicine

## 2014-04-02 ENCOUNTER — Encounter: Payer: Self-pay | Admitting: Family Medicine

## 2014-04-02 VITALS — BP 118/88 | Temp 98.5°F | Ht 67.5 in | Wt 188.1 lb

## 2014-04-02 DIAGNOSIS — N760 Acute vaginitis: Secondary | ICD-10-CM | POA: Insufficient documentation

## 2014-04-02 DIAGNOSIS — Z1151 Encounter for screening for human papillomavirus (HPV): Secondary | ICD-10-CM | POA: Diagnosis present

## 2014-04-02 DIAGNOSIS — R87810 Cervical high risk human papillomavirus (HPV) DNA test positive: Secondary | ICD-10-CM | POA: Diagnosis present

## 2014-04-02 DIAGNOSIS — Z01419 Encounter for gynecological examination (general) (routine) without abnormal findings: Secondary | ICD-10-CM | POA: Diagnosis present

## 2014-04-02 DIAGNOSIS — Z113 Encounter for screening for infections with a predominantly sexual mode of transmission: Secondary | ICD-10-CM | POA: Diagnosis not present

## 2014-04-02 DIAGNOSIS — Z124 Encounter for screening for malignant neoplasm of cervix: Secondary | ICD-10-CM

## 2014-04-02 NOTE — Patient Instructions (Signed)
-  We have ordered labs or studies at this visit. It can take up to 1-2 weeks for results and processing. We will contact you with instructions IF your results are abnormal. Normal results will be released to your MYCHART. If you have not heard from us or can not find your results in MYCHART in 2 weeks please contact our office.        

## 2014-04-02 NOTE — Progress Notes (Signed)
No chief complaint on file.   HPI:  Acute visit for:  Vaginitis: -started about 4 days ago -symptoms: vaginal odor, mild discharge, VV irritation -denies: pelvic pain, new sexual partners, concern for sti, fevers, NVD, pelvic or abd pain, dysuria, irr bleeding -FDLMP: Oct 7th -hx of BV last summer -wants to do flagyl if has this  ROS: See pertinent positives and negatives per HPI.  Past Medical History  Diagnosis Date  . GERD (gastroesophageal reflux disease)     Past Surgical History  Procedure Laterality Date  . Tubal ligation      Family History  Problem Relation Age of Onset  . Hypertension Father   . Diabetes Brother     History   Social History  . Marital Status: Single    Spouse Name: N/A    Number of Children: N/A  . Years of Education: N/A   Social History Main Topics  . Smoking status: Never Smoker   . Smokeless tobacco: Former Systems developer     Comment: quit in 2011, smoke on and off for 8 years  . Alcohol Use: No  . Drug Use: No  . Sexual Activity: None   Other Topics Concern  . None   Social History Narrative   Work or School: works at [employer] Situation: lives with 3 daughter (76, 47, 64)      Spiritual Beliefs: none      Lifestyle: walks a few days per week; diet is so so             Current outpatient prescriptions:Omeprazole (PRILOSEC PO), Take by mouth daily., Disp: , Rfl:   EXAM:  Filed Vitals:   04/02/14 1501  BP: 118/88  Temp: 98.5 F (36.9 C)    Body mass index is 29.01 kg/(m^2).  GENERAL: vitals reviewed and listed above, alert, oriented, appears well hydrated and in no acute distress  HEENT: atraumatic, conjunttiva clear, no obvious abnormalities on inspection of external nose and ears  NECK: no obvious masses on inspection  GU: normal appearance of external genitalia, white homogenous vaginal discharge, cervix somewhat friable in appearance,  No CMT, pap obtained  MS: moves all extremities without  noticeable abnormality  PSYCH: pleasant and cooperative, no obvious depression or anxiety  ASSESSMENT AND PLAN:  Discussed the following assessment and plan:  Vaginitis and vulvovaginitis - Plan: Cervicovaginal ancillary only  Cervical cancer screening - Plan: Cytology - PAP  -wet prep, trich, GC/Chlam pending -discussed tx options and risks of BV and will send treatment if needed -she opted to go ahead and do pap today as she is unsure of date of last pap - reports never had abnormal pap or procedure on cervix -Patient advised to return or notify a doctor immediately if symptoms worsen or persist or new concerns arise.  Patient Instructions  -We have ordered labs or studies at this visit. It can take up to 1-2 weeks for results and processing. We will contact you with instructions IF your results are abnormal. Normal results will be released to your [facility]. If you have not heard from [place] or can not find your results in [facility] in 2 weeks please contact our office.            Colin Benton R.

## 2014-04-02 NOTE — Progress Notes (Signed)
Pre visit review using our clinic review tool, if applicable. No additional management support is needed unless otherwise documented below in the visit note. 

## 2014-04-04 LAB — CERVICOVAGINAL ANCILLARY ONLY
CHLAMYDIA, DNA PROBE: NEGATIVE
NEISSERIA GONORRHEA: NEGATIVE
Trichomonas: NEGATIVE

## 2014-04-04 LAB — CYTOLOGY - PAP

## 2014-04-05 LAB — CERVICOVAGINAL ANCILLARY ONLY: CANDIDA VAGINITIS: NEGATIVE

## 2014-04-09 MED ORDER — METRONIDAZOLE 500 MG PO TABS: 500.0000 mg | ORAL_TABLET | Freq: Two times a day (BID) | ORAL | Status: DC

## 2014-04-09 NOTE — Addendum Note (Signed)
Addended by: Agnes Lawrence on: 04/09/2014 01:35 PM   Modules accepted: Orders

## 2014-05-16 ENCOUNTER — Encounter: Payer: Self-pay | Admitting: Family Medicine

## 2014-05-16 ENCOUNTER — Ambulatory Visit (INDEPENDENT_AMBULATORY_CARE_PROVIDER_SITE_OTHER): Payer: BC Managed Care – PPO | Admitting: Family Medicine

## 2014-05-16 VITALS — BP 116/88 | HR 80 | Temp 98.0°F | Ht 66.75 in | Wt 188.3 lb

## 2014-05-16 DIAGNOSIS — Z0000 Encounter for general adult medical examination without abnormal findings: Secondary | ICD-10-CM

## 2014-05-16 DIAGNOSIS — Z23 Encounter for immunization: Secondary | ICD-10-CM

## 2014-05-16 LAB — LIPID PANEL
Cholesterol: 173 mg/dL (ref 0–200)
HDL: 74 mg/dL (ref >39.00)
LDL Cholesterol: 83 mg/dL (ref 0–99)
NONHDL: 99
Total CHOL/HDL Ratio: 2
Triglycerides: 81 mg/dL (ref 0.0–149.0)
VLDL: 16.2 mg/dL (ref 0.0–40.0)

## 2014-05-16 LAB — HEMOGLOBIN A1C: HEMOGLOBIN A1C: 6.4 % (ref 4.6–6.5)

## 2014-05-16 NOTE — Progress Notes (Signed)
HPI:  Here for CPE:  -Concerns and/or follow up today: none  -Diet: variety of foods, balance and well rounded  -Exercise: no regular exercise  -Taking folic acid, vitamin D or calcium: no  -Diabetes and Dyslipidemia Screening: doing today  -Hx of HTN: no  -Vaccines: UTD  -pap history: neg pap but had HPV 03/2014  -FDLMP: started last week  -sexual activity: yes, female partner, no new partners  -wants STI testing: wants hiv and rpr, had gc/chlam recently and declines  -FH breast, colon or ovarian ca: see FH Last mammogram:n/a Last colon cancer screening:n/a  -Alcohol, Tobacco, drug use: see social history  Review of Systems - no fevers, unintentional weight loss, vision loss, hearing loss, chest pain, sob, hemoptysis, melena, hematochezia, hematuria, genital discharge, changing or concerning skin lesions, bleeding, bruising, loc, thoughts of self harm or SI  Past Medical History  Diagnosis Date  . GERD (gastroesophageal reflux disease)     Past Surgical History  Procedure Laterality Date  . Tubal ligation      Family History  Problem Relation Age of Onset  . Hypertension Father   . Diabetes Brother     History   Social History  . Marital Status: Single    Spouse Name: N/A    Number of Children: N/A  . Years of Education: N/A   Social History Main Topics  . Smoking status: Never Smoker   . Smokeless tobacco: Former Systems developer     Comment: quit in 2011, smoke on and off for 8 years  . Alcohol Use: No  . Drug Use: No  . Sexual Activity: None   Other Topics Concern  . None   Social History Narrative   Work or School: works at [employer] Situation: lives with 3 daughter (43, 51, 35)      Spiritual Beliefs: none      Lifestyle: walks a few days per week; diet is so so             Current outpatient prescriptions: Omeprazole (PRILOSEC PO), Take by mouth daily., Disp: , Rfl:   EXAM:  Filed Vitals:   05/16/14 1102  BP: 116/88  Pulse:  80  Temp: 98 F (36.7 C)    GENERAL: vitals reviewed and listed below, alert, oriented, appears well hydrated and in no acute distress  HEENT: head atraumatic, PERRLA, normal appearance of eyes, ears, nose and mouth. moist mucus membranes.  NECK: supple, no masses or lymphadenopathy  LUNGS: clear to auscultation bilaterally, no rales, rhonchi or wheeze  CV: HRRR, no peripheral edema or cyanosis, normal pedal pulses  BREAST: normal appearance - no lesions or discharge, on palpation normal breast tissue without any suspicious masses  ABDOMEN: bowel sounds normal, soft, non tender to palpation, no masses, no rebound or guarding  GU: declined  RECTAL: refused  SKIN: no rash or abnormal lesions  MS: normal gait, moves all extremities normally  NEURO: CN II-XII grossly intact, normal muscle strength and sensation to light touch on extremities  PSYCH: normal affect, pleasant and cooperative  ASSESSMENT AND PLAN:  Discussed the following assessment and plan:  Visit for preventive health examination - Plan: Lipid Panel, Hemoglobin A1c, HIV antibody (with reflex), RPR  Need for Tdap vaccination - Plan: Tdap vaccine greater than or equal to 7yo IM   -Discussed and advised all [place] preventive services health task force level A and B recommendations for age, sex and risks.  -Advised at least 150 minutes of  exercise per week and a healthy diet low in saturated fats and sweets and consisting of fresh fruits and vegetables, lean meats such as fish and white chicken and whole grains.  -FASTING labs, studies and vaccines per orders this encounter  Orders Placed This Encounter  Procedures  . Tdap vaccine greater than or equal to 7yo IM  . Lipid Panel  . Hemoglobin A1c  . HIV antibody (with reflex)  . RPR    Patient advised to return to clinic immediately if symptoms worsen or persist or new concerns.  Patient Instructions  BEFORE YOU LEAVE: -Tdap vaccine -labs -schedule  physical with pap in October 2016  -We have ordered labs or studies at this visit. It can take up to 1-2 weeks for results and processing. We will contact you with instructions IF your results are abnormal. Normal results will be released to your [facility]. If you have not heard from [place] or can not find your results in [facility] in 2 weeks please contact our office.  -PLEASE SIGN UP FOR MYCHART TODAY   We recommend the following healthy lifestyle measures: - eat a healthy diet consisting of lots of vegetables, fruits, beans, nuts, seeds, healthy meats such as white chicken and fish and whole grains.  - avoid fried foods, fast food, processed foods, sodas, red meet and other fattening foods.  - get a least 150 minutes of aerobic exercise per week.        No Follow-up on file.  Colin Benton R.

## 2014-05-16 NOTE — Progress Notes (Signed)
Pre visit review using our clinic review tool, if applicable. No additional management support is needed unless otherwise documented below in the visit note. 

## 2014-05-16 NOTE — Patient Instructions (Addendum)
BEFORE YOU LEAVE: -Tdap vaccine -labs -schedule physical with pap in October 2016  -We have ordered labs or studies at this visit. It can take up to 1-2 weeks for results and processing. We will contact you with instructions IF your results are abnormal. Normal results will be released to your [facility]. If you have not heard from [place] or can not find your results in [facility] in 2 weeks please contact our office.  -PLEASE SIGN UP FOR MYCHART TODAY   We recommend the following healthy lifestyle measures: - eat a healthy diet consisting of lots of vegetables, fruits, beans, nuts, seeds, healthy meats such as white chicken and fish and whole grains.  - avoid fried foods, fast food, processed foods, sodas, red meet and other fattening foods.  - get a least 150 minutes of aerobic exercise per week.

## 2014-05-17 LAB — HIV ANTIBODY (ROUTINE TESTING W REFLEX): HIV 1&2 Ab, 4th Generation: NONREACTIVE

## 2014-05-17 LAB — RPR

## 2014-08-22 ENCOUNTER — Encounter: Payer: Self-pay | Admitting: Family Medicine

## 2014-08-22 ENCOUNTER — Ambulatory Visit (INDEPENDENT_AMBULATORY_CARE_PROVIDER_SITE_OTHER): Payer: BLUE CROSS/BLUE SHIELD | Admitting: Family Medicine

## 2014-08-22 VITALS — BP 122/82 | HR 92 | Temp 98.4°F | Ht 66.5 in | Wt 182.3 lb

## 2014-08-22 DIAGNOSIS — R739 Hyperglycemia, unspecified: Secondary | ICD-10-CM

## 2014-08-22 DIAGNOSIS — E663 Overweight: Secondary | ICD-10-CM

## 2014-08-22 LAB — HEMOGLOBIN A1C: Hgb A1c MFr Bld: 6.3 % (ref 4.6–6.5)

## 2014-08-22 NOTE — Patient Instructions (Signed)
BEFORE YOU LEAVE: -labs -follow up 4 months  We recommend the following healthy lifestyle measures: - eat a healthy diet consisting of lots of vegetables, fruits, beans, nuts, seeds, healthy meats such as white chicken and fish and whole grains.  - avoid fried foods, fast food, processed foods, sodas, red meet and other fattening foods.  - get a least 150 minutes of aerobic exercise per week.

## 2014-08-22 NOTE — Addendum Note (Signed)
Addended by: [name] on: 08/22/2014 09:16 AM   Modules accepted: Level of Service

## 2014-08-22 NOTE — Progress Notes (Signed)
Pre visit review using our clinic review tool, if applicable. No additional management support is needed unless otherwise documented below in the visit note. 

## 2014-08-22 NOTE — Progress Notes (Signed)
  HPI:  Follow up:  Prediabetes: -diet: she has been eating more veggies, lighter meals, avoiding carbs -exercise: has personal trainer - daily 30 minutes of boot camp - started 3 weeks ago -denies: polyuria, polydipsia, vision, fatigue  ROS: See pertinent positives and negatives per HPI.  Past Medical History  Diagnosis Date  . GERD (gastroesophageal reflux disease)     Past Surgical History  Procedure Laterality Date  . Tubal ligation      Family History  Problem Relation Age of Onset  . Hypertension Father   . Diabetes Brother     History   Social History  . Marital Status: Single    Spouse Name: N/A  . Number of Children: N/A  . Years of Education: N/A   Social History Main Topics  . Smoking status: Never Smoker   . Smokeless tobacco: Former Systems developer     Comment: quit in 2011, smoke on and off for 8 years  . Alcohol Use: No  . Drug Use: No  . Sexual Activity: Not on file   Other Topics Concern  . None   Social History Narrative   Work or School: works at [employer] Situation: lives with 3 daughter (78, 75, 44)      Spiritual Beliefs: none      Lifestyle: walks a few days per week; diet is so so              Current outpatient prescriptions:  .  Omeprazole (PRILOSEC PO), Take by mouth daily., Disp: , Rfl:  .  Probiotic Product (PROBIOTIC DAILY PO), Take by mouth., Disp: , Rfl:   EXAM:  Filed Vitals:   08/22/14 0859  BP: 122/82  Pulse: 92  Temp: 98.4 F (36.9 C)    Body mass index is 28.99 kg/(m^2).  GENERAL: vitals reviewed and listed above, alert, oriented, appears well hydrated and in no acute distress  HEENT: atraumatic, conjunttiva clear, no obvious abnormalities on inspection of external nose and ears  NECK: no obvious masses on inspection  LUNGS: clear to auscultation bilaterally, no wheezes, rales or rhonchi, good air movement  CV: HRRR, no peripheral edema  MS: moves all extremities without noticeable  abnormality  PSYCH: pleasant and cooperative, no obvious depression or anxiety  ASSESSMENT AND PLAN:  Discussed the following assessment and plan:  Hyperglycemia - Plan: Hemoglobin A1c  Overweight  -Patient advised to return or notify a doctor immediately if symptoms worsen or persist or new concerns arise.  Patient Instructions  BEFORE YOU LEAVE: -labs -follow up 4 months  We recommend the following healthy lifestyle measures: - eat a healthy diet consisting of lots of vegetables, fruits, beans, nuts, seeds, healthy meats such as white chicken and fish and whole grains.  - avoid fried foods, fast food, processed foods, sodas, red meet and other fattening foods.  - get a least 150 minutes of aerobic exercise per week.       Colin Benton R.

## 2014-12-12 ENCOUNTER — Telehealth: Payer: Self-pay | Admitting: Family Medicine

## 2014-12-12 NOTE — Telephone Encounter (Signed)
Pt has a question she would like to ask jo anne pt would not elaborate.

## 2014-12-12 NOTE — Telephone Encounter (Signed)
I called the pt and she stated she feels she may have BV again as she complains of vaginal discharge.  I offered a few different dates and an appt was scheduled for 12/16/2014.

## 2014-12-16 ENCOUNTER — Other Ambulatory Visit (HOSPITAL_COMMUNITY)
Admission: RE | Admit: 2014-12-16 | Discharge: 2014-12-16 | Disposition: A | Discharge status: Home / Self Care | Payer: BLUE CROSS/BLUE SHIELD | Source: Ambulatory Visit | Attending: Family Medicine | Admitting: Family Medicine

## 2014-12-16 ENCOUNTER — Encounter: Payer: Self-pay | Admitting: Family Medicine

## 2014-12-16 ENCOUNTER — Ambulatory Visit (INDEPENDENT_AMBULATORY_CARE_PROVIDER_SITE_OTHER): Payer: BLUE CROSS/BLUE SHIELD | Admitting: Family Medicine

## 2014-12-16 VITALS — BP 108/80 | HR 96 | Temp 98.6°F | Ht 66.5 in | Wt 185.7 lb

## 2014-12-16 DIAGNOSIS — N898 Other specified noninflammatory disorders of vagina: Secondary | ICD-10-CM | POA: Diagnosis not present

## 2014-12-16 DIAGNOSIS — R300 Dysuria: Secondary | ICD-10-CM | POA: Diagnosis not present

## 2014-12-16 DIAGNOSIS — N760 Acute vaginitis: Secondary | ICD-10-CM | POA: Insufficient documentation

## 2014-12-16 DIAGNOSIS — Z113 Encounter for screening for infections with a predominantly sexual mode of transmission: Secondary | ICD-10-CM | POA: Diagnosis present

## 2014-12-16 LAB — POCT URINALYSIS DIPSTICK
Bilirubin, UA: NEGATIVE
Blood, UA: NEGATIVE
Glucose, UA: NEGATIVE
Ketones, UA: NEGATIVE
NITRITE UA: NEGATIVE
PROTEIN UA: NEGATIVE
SPEC GRAV UA: 1.02
UROBILINOGEN UA: 0.2
pH, UA: 7

## 2014-12-16 NOTE — Patient Instructions (Signed)
-  We have ordered labs or studies at this visit. It can take up to 1-2 weeks for results and processing. We will contact you with instructions IF your results are abnormal. Normal results will be released to your [facility]. If you have not heard from [place] or can not find your results in [facility] in 2 weeks please contact our office.

## 2014-12-16 NOTE — Addendum Note (Signed)
Addended by: Agnes Lawrence on: 12/16/2014 10:35 AM   Modules accepted: Orders

## 2014-12-16 NOTE — Progress Notes (Signed)
  HPI:  Vaginal discharge: -started last week -vulvovag pruritis -had BV bout 6 months ago and thinks this is the sam -white discharge -mild urinary frequency and urgency -denies: fevers, nausea, vomiting, flank pain, abd pain, new sexual partners -she declines concerns for STI -[facility] June 25th  ROS: See pertinent positives and negatives per HPI.  Past Medical History  Diagnosis Date  . GERD (gastroesophageal reflux disease)     Past Surgical History  Procedure Laterality Date  . Tubal ligation      Family History  Problem Relation Age of Onset  . Hypertension Father   . Diabetes Brother     History   Social History  . Marital Status: Single    Spouse Name: N/A  . Number of Children: N/A  . Years of Education: N/A   Social History Main Topics  . Smoking status: Never Smoker   . Smokeless tobacco: Former Systems developer     Comment: quit in 2011, smoke on and off for 8 years  . Alcohol Use: No  . Drug Use: No  . Sexual Activity: Not on file   Other Topics Concern  . None   Social History Narrative   Work or School: works at [employer] Situation: lives with 3 daughter (32, 21, 38)      Spiritual Beliefs: none      Lifestyle: walks a few days per week; diet is so so              Current outpatient prescriptions:  .  Omeprazole (PRILOSEC PO), Take by mouth daily., Disp: , Rfl:  .  Probiotic Product (PROBIOTIC DAILY PO), Take by mouth., Disp: , Rfl:   EXAM:  Filed Vitals:   12/16/14 1015  BP: 108/80  Pulse: 96  Temp: 98.6 F (37 C)    Body mass index is 29.53 kg/(m^2).  GENERAL: vitals reviewed and listed above, alert, oriented, appears well hydrated and in no acute distress  HEENT: atraumatic, conjunttiva clear, no obvious abnormalities on inspection of external nose and ears  NECK: no obvious masses on inspection  LUNGS: clear to auscultation bilaterally, no wheezes, rales or rhonchi, good air movement  CV: HRRR, no peripheral  edema  GU: homogenous white discharge, normal exam o/w, no CMT  ABD: soft, No CVA TTP  MS: moves all extremities without noticeable abnormality  PSYCH: pleasant and cooperative, no obvious depression or anxiety  ASSESSMENT AND PLAN:  Discussed the following assessment and plan:  Dysuria  Vaginal discharge  -udip, upreg, GC/Chlam, candida, trich, gardnella pending -Patient advised to return or notify a doctor immediately if symptoms worsen or persist or new concerns arise.  There are no Patient Instructions on file for this visit.   Colin Benton R.

## 2014-12-16 NOTE — Progress Notes (Signed)
Pre visit review using our clinic review tool, if applicable. No additional management support is needed unless otherwise documented below in the visit note. 

## 2014-12-17 LAB — CERVICOVAGINAL ANCILLARY ONLY
Chlamydia: NEGATIVE
Neisseria Gonorrhea: NEGATIVE
TRICH (WINDOWPATH): NEGATIVE

## 2014-12-18 LAB — CERVICOVAGINAL ANCILLARY ONLY: Candida vaginitis: NEGATIVE

## 2014-12-18 LAB — URINE CULTURE
Colony Count: NO GROWTH
Organism ID, Bacteria: NO GROWTH

## 2014-12-20 MED ORDER — METRONIDAZOLE 500 MG PO TABS: 500.0000 mg | ORAL_TABLET | Freq: Two times a day (BID) | ORAL | Status: DC

## 2014-12-20 NOTE — Addendum Note (Signed)
Addended by: Agnes Lawrence on: 12/20/2014 05:45 PM   Modules accepted: Orders

## 2014-12-26 ENCOUNTER — Ambulatory Visit: Payer: BLUE CROSS/BLUE SHIELD | Admitting: Family Medicine

## 2015-02-21 ENCOUNTER — Telehealth: Payer: Self-pay | Admitting: Family Medicine

## 2015-02-21 NOTE — Telephone Encounter (Signed)
Dr. Kim FYI

## 2015-02-21 NOTE — Telephone Encounter (Signed)
Pt states she would like to speak w/ joann personally and refused to elaborate. pls call back

## 2015-02-21 NOTE — Telephone Encounter (Signed)
I called the pt and she states she has the same symptoms from her last visit and she cannot come in until Thursday or she needs an early appt since she has to be at work at [employer] scheduled for Thursday.

## 2015-02-27 ENCOUNTER — Other Ambulatory Visit (HOSPITAL_COMMUNITY)
Admission: RE | Admit: 2015-02-27 | Discharge: 2015-02-27 | Disposition: A | Discharge status: Home / Self Care | Payer: BLUE CROSS/BLUE SHIELD | Source: Ambulatory Visit | Attending: Family Medicine | Admitting: Family Medicine

## 2015-02-27 ENCOUNTER — Ambulatory Visit (INDEPENDENT_AMBULATORY_CARE_PROVIDER_SITE_OTHER): Payer: BLUE CROSS/BLUE SHIELD | Admitting: Family Medicine

## 2015-02-27 ENCOUNTER — Encounter: Payer: Self-pay | Admitting: Family Medicine

## 2015-02-27 VITALS — BP 118/80 | HR 86 | Temp 98.6°F | Ht 66.5 in | Wt 177.4 lb

## 2015-02-27 DIAGNOSIS — N898 Other specified noninflammatory disorders of vagina: Secondary | ICD-10-CM

## 2015-02-27 DIAGNOSIS — Z113 Encounter for screening for infections with a predominantly sexual mode of transmission: Secondary | ICD-10-CM | POA: Insufficient documentation

## 2015-02-27 DIAGNOSIS — N760 Acute vaginitis: Secondary | ICD-10-CM | POA: Insufficient documentation

## 2015-02-27 MED ORDER — METRONIDAZOLE 500 MG PO TABS: 500.0000 mg | ORAL_TABLET | Freq: Two times a day (BID) | ORAL | Status: DC

## 2015-02-27 NOTE — Patient Instructions (Signed)
-  We have ordered labs or studies at this visit. It can take up to 1-2 weeks for results and processing. We will contact you with instructions IF your results are abnormal. Normal results will be released to your MYCHART. If you have not heard from us or can not find your results in MYCHART in 2 weeks please contact our office.        

## 2015-02-27 NOTE — Progress Notes (Signed)
Pre visit review using our clinic review tool, if applicable. No additional management support is needed unless otherwise documented below in the visit note. 

## 2015-02-27 NOTE — Progress Notes (Signed)
  HPI:  Vulvovaginal pruritis: -started about 1 week ago -symptoms: white discharge and vulvag prurtis -denies: dysuria, pelvic or flank pain, fevers, NVD, concern for STIs, new sexual partners -FDLMP 1 week ago  -denies any chance or plans for pregnancy  ROS: See pertinent positives and negatives per HPI.  Past Medical History  Diagnosis Date  . GERD (gastroesophageal reflux disease)     Past Surgical History  Procedure Laterality Date  . Tubal ligation      Family History  Problem Relation Age of Onset  . Hypertension Father   . Diabetes Brother     Social History   Social History  . Marital Status: Single    Spouse Name: N/A  . Number of Children: N/A  . Years of Education: N/A   Social History Main Topics  . Smoking status: Never Smoker   . Smokeless tobacco: Former Systems developer     Comment: quit in 2011, smoke on and off for 8 years  . Alcohol Use: No  . Drug Use: No  . Sexual Activity: Not Asked   Other Topics Concern  . None   Social History Narrative   Work or School: works at [employer] Situation: lives with 3 daughter (2, 38, 18)      Spiritual Beliefs: none      Lifestyle: walks a few days per week; diet is so so              Current outpatient prescriptions:  .  metroNIDAZOLE (FLAGYL) 500 MG tablet, Take 1 tablet (500 mg total) by mouth 2 (two) times daily., Disp: 14 tablet, Rfl: 0  EXAM:  Filed Vitals:   02/27/15 1020  BP: 118/80  Pulse: 86  Temp: 98.6 F (37 C)    Body mass index is 28.21 kg/(m^2).  GENERAL: vitals reviewed and listed above, alert, oriented, appears well hydrated and in no acute distress  HEENT: atraumatic, conjunttiva clear, no obvious abnormalities on inspection of external nose and ears  NECK: no obvious masses on inspection  ABD:BS, soft, NTTP  GU: normal appearance of external genitalia, white homogenous vaginal discharge, no CMT, cervix and os appear normal  MS: moves all extremities without  noticeable abnormality  PSYCH: pleasant and cooperative, no obvious depression or anxiety  ASSESSMENT AND PLAN:  Discussed the following assessment and plan:  Vaginal discharge - Plan: Cervicovaginal ancillary only, metroNIDAZOLE (FLAGYL) 500 MG tablet  -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  Patient Instructions  -We have ordered labs or studies at this visit. It can take up to 1-2 weeks for results and processing. We will contact you with instructions IF your results are abnormal. Normal results will be released to your [facility]. If you have not heard from [place] or can not find your results in [facility] in 2 weeks please contact our office.            Colin Benton R.

## 2015-02-28 LAB — CERVICOVAGINAL ANCILLARY ONLY
Chlamydia: NEGATIVE
NEISSERIA GONORRHEA: NEGATIVE
TRICH (WINDOWPATH): NEGATIVE

## 2015-03-05 LAB — CERVICOVAGINAL ANCILLARY ONLY: CANDIDA VAGINITIS: NEGATIVE

## 2015-03-06 MED ORDER — METRONIDAZOLE 0.75 % VA GEL: VAGINAL | Status: DC

## 2015-03-06 NOTE — Addendum Note (Signed)
Addended by: Agnes Lawrence on: 03/06/2015 05:08 PM   Modules accepted: Orders

## 2015-03-07 ENCOUNTER — Telehealth: Payer: Self-pay | Admitting: Family Medicine

## 2015-03-07 ENCOUNTER — Other Ambulatory Visit: Payer: Self-pay | Admitting: *Deleted

## 2015-03-07 MED ORDER — METRONIDAZOLE 0.75 % VA GEL: VAGINAL | Status: DC

## 2015-03-07 NOTE — Telephone Encounter (Signed)
Rx done. 

## 2015-03-07 NOTE — Telephone Encounter (Signed)
[facility] outpatient pharmacy called in for clarification on medications for Farrel Gobble

## 2015-03-07 NOTE — Telephone Encounter (Signed)
Rx re-sent for quantity to last 4 months.

## 2015-05-22 ENCOUNTER — Ambulatory Visit (INDEPENDENT_AMBULATORY_CARE_PROVIDER_SITE_OTHER): Payer: BLUE CROSS/BLUE SHIELD | Admitting: Family Medicine

## 2015-05-22 DIAGNOSIS — R69 Illness, unspecified: Secondary | ICD-10-CM

## 2015-05-22 NOTE — Progress Notes (Signed)
NO SHOW

## 2015-06-05 ENCOUNTER — Encounter: Payer: Self-pay | Admitting: Family Medicine

## 2015-06-05 ENCOUNTER — Other Ambulatory Visit (HOSPITAL_COMMUNITY)
Admission: RE | Admit: 2015-06-05 | Discharge: 2015-06-05 | Disposition: A | Discharge status: Home / Self Care | Payer: BLUE CROSS/BLUE SHIELD | Source: Ambulatory Visit | Attending: Family Medicine | Admitting: Family Medicine

## 2015-06-05 ENCOUNTER — Ambulatory Visit (INDEPENDENT_AMBULATORY_CARE_PROVIDER_SITE_OTHER): Payer: BLUE CROSS/BLUE SHIELD | Admitting: Family Medicine

## 2015-06-05 VITALS — BP 118/82 | HR 92 | Temp 98.7°F | Ht 67.25 in | Wt 175.9 lb

## 2015-06-05 DIAGNOSIS — Z1151 Encounter for screening for human papillomavirus (HPV): Secondary | ICD-10-CM | POA: Diagnosis not present

## 2015-06-05 DIAGNOSIS — B977 Papillomavirus as the cause of diseases classified elsewhere: Secondary | ICD-10-CM

## 2015-06-05 DIAGNOSIS — Z124 Encounter for screening for malignant neoplasm of cervix: Secondary | ICD-10-CM | POA: Diagnosis not present

## 2015-06-05 DIAGNOSIS — Z01419 Encounter for gynecological examination (general) (routine) without abnormal findings: Secondary | ICD-10-CM | POA: Diagnosis present

## 2015-06-05 DIAGNOSIS — R739 Hyperglycemia, unspecified: Secondary | ICD-10-CM | POA: Diagnosis not present

## 2015-06-05 DIAGNOSIS — Z0000 Encounter for general adult medical examination without abnormal findings: Secondary | ICD-10-CM | POA: Diagnosis not present

## 2015-06-05 LAB — LIPID PANEL
CHOL/HDL RATIO: 2
Cholesterol: 157 mg/dL (ref 0–200)
HDL: 71.1 mg/dL (ref >39.00)
LDL CALC: 72 mg/dL (ref 0–99)
NONHDL: 86.15
TRIGLYCERIDES: 73 mg/dL (ref 0.0–149.0)
VLDL: 14.6 mg/dL (ref 0.0–40.0)

## 2015-06-05 LAB — HEMOGLOBIN A1C: HEMOGLOBIN A1C: 6 % (ref 4.6–6.5)

## 2015-06-05 NOTE — Progress Notes (Signed)
Pre visit review using our clinic review tool, if applicable. No additional management support is needed unless otherwise documented below in the visit note. 

## 2015-06-05 NOTE — Progress Notes (Signed)
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 GERD (gastroesophageal reflux disease)     Past Surgical History  Procedure Laterality Date  . Tubal ligation      Family History  Problem Relation Age of Onset  . Hypertension Father   . Diabetes Brother     Social History   Social History  . Marital Status: Single    Spouse Name: N/A  . Number of Children: N/A  . Years of Education: N/A   Social History Main Topics  . Smoking status: Never Smoker   . Smokeless tobacco: Former Systems developer     Comment: quit in 2011, smoke on and off for 8 years  . Alcohol Use: No  . Drug Use: No  . Sexual Activity: Not Asked   Other Topics Concern  . None   Social History Narrative   Work or School: works at [employer]: lives with 3 daughter (42, 24, 35)      Spiritual Beliefs: none      Lifestyle: walks a few days per week; diet is so so              Current outpatient prescriptions:  .  metroNIDAZOLE (METROGEL VAGINAL) 0.75 % vaginal gel, Insert 1 applicator full vaginally twice a week for 4 months, Disp: 210 g, Rfl: 0  EXAM:  Filed Vitals:   06/05/15 0809  BP: 118/82  Pulse: 92  Temp:  98.7 F (37.1 C)    GENERAL: vitals reviewed and listed below, alert, oriented, appears well hydrated and in no acute distress  HEENT: head atraumatic, PERRLA, normal appearance of eyes, ears, nose and mouth. moist mucus membranes.  NECK: supple, no masses or lymphadenopathy  LUNGS: clear to auscultation bilaterally, no rales, rhonchi or wheeze  CV: HRRR, no peripheral edema or cyanosis, normal pedal pulses  BREAST: normal appearance - no lesions or discharge, on palpation normal breast tissue without any suspicious masses  ABDOMEN: bowel sounds normal, soft, non tender to palpation, no masses, no rebound or guarding  GU: normal appearance of external genitalia - no lesions or masses, normal vaginal mucosa - no abnormal discharge, normal appearance of cervix - no lesions or abnormal discharge, no masses or tenderness on palpation of uterus and ovaries.  RECTAL: refused  SKIN: no rash or abnormal lesions  MS: normal gait, moves all extremities normally  NEURO: CN II-XII grossly intact, normal muscle strength and sensation to light touch on extremities  PSYCH: normal affect, pleasant and cooperative  ASSESSMENT AND PLAN:  Discussed the following assessment and plan:  Visit for preventive health examination - Plan: Lipid Panel -Discussed and advised all [place] preventive services health task force level A and B recommendations  for age, sex and risks. -Advised at least 150 minutes of exercise per week and a healthy diet low in saturated fats and sweets and consisting of fresh fruits and vegetables, lean meats such as fish and white chicken and whole grains. -labs, studies and vaccines per orders this encounter HPV in female  -if HPV or abnormal pap will refer to gyn for colpo - discussed with patient  Hyperglycemia - Plan: Hemoglobin A1c -lifestyle recs  Orders Placed This Encounter  Procedures  . Lipid Panel  . Hemoglobin A1c    Patient advised to return to clinic  immediately if symptoms worsen or persist or new concerns.  Patient Instructions  BEFORE YOU LEAVE: -labs -follow up in 6 months  -We have ordered labs or studies at this visit. It can take up to 1-2 weeks for results and processing. We will contact you with instructions IF your results are abnormal. Normal results will be released to your [facility]. If you have not heard from [place] or can not find your results in [facility] in 2 weeks please contact our office.  -if the pap smear is abnormal we will have you see the gynecologist for another type of testing as we discussed  We recommend the following healthy lifestyle measures: - eat a healthy whole foods diet consisting of regular small meals composed of vegetables, fruits, beans, nuts, seeds, healthy meats such as white chicken and fish and whole grains.  - avoid sweets, white starchy foods, fried foods, fast food, processed foods, sodas, red meet and other fattening foods.  - get a least 150-300 minutes of aerobic exercise per week.   Vit D3 [phone] IU daily        No Follow-up on file.  Colin Benton R.

## 2015-06-05 NOTE — Addendum Note (Signed)
Addended by: Agnes Lawrence on: 06/05/2015 08:38 AM   Modules accepted: Orders

## 2015-06-05 NOTE — Patient Instructions (Addendum)
BEFORE YOU LEAVE: -labs -follow up in 6 months  -We have ordered labs or studies at this visit. It can take up to 1-2 weeks for results and processing. We will contact you with instructions IF your results are abnormal. Normal results will be released to your [facility]. If you have not heard from [place] or can not find your results in [facility] in 2 weeks please contact our office.  -if the pap smear is abnormal we will have you see the gynecologist for another type of testing as we discussed  We recommend the following healthy lifestyle measures: - eat a healthy whole foods diet consisting of regular small meals composed of vegetables, fruits, beans, nuts, seeds, healthy meats such as white chicken and fish and whole grains.  - avoid sweets, white starchy foods, fried foods, fast food, processed foods, sodas, red meet and other fattening foods.  - get a least 150-300 minutes of aerobic exercise per week.   Vit D3 [phone] IU daily

## 2015-06-06 LAB — CYTOLOGY - PAP

## 2015-08-28 ENCOUNTER — Telehealth: Payer: Self-pay | Admitting: Family Medicine

## 2015-08-28 NOTE — Telephone Encounter (Signed)
Pt would not elaborate the reason for the call. Pt would like to speak with jo ann to see if she needs an appointment

## 2015-08-28 NOTE — Telephone Encounter (Signed)
I called the pt and she stated she feels the heartburn is worse and she changed medications and this did not help.  She stated she has had left sided chest pain intermittently since Sunday.  She states the pain radiates to her back, does notice shortness of breath at times, if she lies down on her left side she has to change to the right, has been burping and denies any nausea or vomiting.  I advised her she should go to an urgent care and she stated she did not want to pay the copay.   I informed Tommi Rumps of this and he stated the pt should be seen somewhere and try to go to an urgent care immediately due to her symptoms and she agreed.  I suggested the Urgent [facility] on [address] or [facility] if these were close to her home and she agreed.

## 2015-10-09 ENCOUNTER — Encounter: Payer: Self-pay | Admitting: Family Medicine

## 2015-10-09 ENCOUNTER — Ambulatory Visit (INDEPENDENT_AMBULATORY_CARE_PROVIDER_SITE_OTHER): Payer: BLUE CROSS/BLUE SHIELD | Admitting: Family Medicine

## 2015-10-09 ENCOUNTER — Other Ambulatory Visit: Payer: Self-pay | Admitting: Family Medicine

## 2015-10-09 VITALS — BP 100/72 | HR 74 | Temp 98.3°F | Ht 67.25 in | Wt 177.9 lb

## 2015-10-09 DIAGNOSIS — R1013 Epigastric pain: Secondary | ICD-10-CM

## 2015-10-09 DIAGNOSIS — N923 Ovulation bleeding: Secondary | ICD-10-CM

## 2015-10-09 DIAGNOSIS — D649 Anemia, unspecified: Secondary | ICD-10-CM

## 2015-10-09 DIAGNOSIS — K219 Gastro-esophageal reflux disease without esophagitis: Secondary | ICD-10-CM

## 2015-10-09 DIAGNOSIS — N920 Excessive and frequent menstruation with regular cycle: Secondary | ICD-10-CM

## 2015-10-09 LAB — COMPREHENSIVE METABOLIC PANEL
ALT: 8 U/L (ref 0–35)
AST: 9 U/L (ref 0–37)
Albumin: 4.3 g/dL (ref 3.5–5.2)
Alkaline Phosphatase: 58 U/L (ref 39–117)
BUN: 10 mg/dL (ref 6–23)
CHLORIDE: 105 meq/L (ref 96–112)
CO2: 26 meq/L (ref 19–32)
CREATININE: 0.77 mg/dL (ref 0.40–1.20)
Calcium: 9.2 mg/dL (ref 8.4–10.5)
GFR: 108.04 mL/min (ref >60.00)
GLUCOSE: 106 mg/dL — AB (ref 70–99)
Potassium: 4 mEq/L (ref 3.5–5.1)
SODIUM: 139 meq/L (ref 135–145)
Total Bilirubin: 0.3 mg/dL (ref 0.2–1.2)
Total Protein: 7.3 g/dL (ref 6.0–8.3)

## 2015-10-09 LAB — CBC WITH DIFFERENTIAL/PLATELET
BASOS PCT: 0.6 % (ref 0.0–3.0)
Basophils Absolute: 0 10*3/uL (ref 0.0–0.1)
EOS ABS: 0.1 10*3/uL (ref 0.0–0.7)
Eosinophils Relative: 1.2 % (ref 0.0–5.0)
HEMATOCRIT: 31.5 % — AB (ref 36.0–46.0)
Hemoglobin: 9.7 g/dL — ABNORMAL LOW (ref 12.0–15.0)
Lymphocytes Relative: 35.2 % (ref 12.0–46.0)
Lymphs Abs: 2.3 10*3/uL (ref 0.7–4.0)
MCHC: 30.7 g/dL (ref 30.0–36.0)
MONO ABS: 0.5 10*3/uL (ref 0.1–1.0)
Monocytes Relative: 7 % (ref 3.0–12.0)
NEUTROS ABS: 3.7 10*3/uL (ref 1.4–7.7)
Neutrophils Relative %: 56 % (ref 43.0–77.0)
PLATELETS: 490 10*3/uL — AB (ref 150.0–400.0)
RBC: 4.83 Mil/uL (ref 3.87–5.11)
RDW: 18.6 % — AB (ref 11.5–15.5)
WBC: 6.6 10*3/uL (ref 4.0–10.5)

## 2015-10-09 LAB — LIPASE: Lipase: 20 U/L (ref 11.0–59.0)

## 2015-10-09 MED ORDER — PANTOPRAZOLE SODIUM 40 MG PO TBEC: 40.0000 mg | DELAYED_RELEASE_TABLET | Freq: Every day | ORAL | Status: DC

## 2015-10-09 NOTE — Progress Notes (Signed)
Pre visit review using our clinic review tool, if applicable. No additional management support is needed unless otherwise documented below in the visit note. 

## 2015-10-09 NOTE — Progress Notes (Signed)
HPI:  Diamond Hansen is a very pleasant 40 year old with a past medical history significant for GERD here for an acute visit for worsening acid reflux. She has had intermittent acid reflux chronically for which she uses Prilosec sporadically. For the last 3 weeks her symptoms have been worse with heartburn, epigastric discomfort that is worse at night and a bad taste in her mouth and acid in her throat in the morning. The over-the-counter Prilosec was not working so she tried Nexium and did not like the side effects. Denies fevers, malaise, weight loss, vomiting, nausea, dysphagia, change in bowels, diarrhea, constipation, melena, hematochezia, cough or recent illness or antibiotic use. She does drink coffee daily. S/p tubal ligation.  ROS: See pertinent positives and negatives per HPI.  Past Medical History  Diagnosis Date  . GERD (gastroesophageal reflux disease)     Past Surgical History  Procedure Laterality Date  . Tubal ligation      Family History  Problem Relation Age of Onset  . Hypertension Father   . Diabetes Brother     Social History   Social History  . Marital Status: Single    Spouse Name: N/A  . Number of Children: N/A  . Years of Education: N/A   Social History Main Topics  . Smoking status: Never Smoker   . Smokeless tobacco: Former Systems developer     Comment: quit in 2011, smoke on and off for 8 years  . Alcohol Use: No  . Drug Use: No  . Sexual Activity: Not Asked   Other Topics Concern  . None   Social History Narrative   Work or School: works at [employer] Situation: lives with 3 daughter (82, 36, 80)      Spiritual Beliefs: none      Lifestyle: walks a few days per week; diet is so so              Current outpatient prescriptions:  .  Omeprazole (PRILOSEC PO), Take by mouth., Disp: , Rfl:  .  pantoprazole (PROTONIX) 40 MG tablet, Take 1 tablet (40 mg total) by mouth daily., Disp: 30 tablet, Rfl: 0  EXAM:  Filed Vitals:   10/09/15 0902   BP: 100/72  Pulse: 74  Temp: 98.3 F (36.8 C)    Body mass index is 27.66 kg/(m^2).  GENERAL: vitals reviewed and listed above, alert, oriented, appears well hydrated and in no acute distress  HEENT: atraumatic, conjunttiva clear, no obvious abnormalities on inspection of external nose and ears  NECK: no obvious masses on inspection  LUNGS: clear to auscultation bilaterally, no wheezes, rales or rhonchi, good air movement  CV: HRRR, no peripheral edema  ABD: BS+, soft, NTTP  MS: moves all extremities without noticeable abnormality  PSYCH: pleasant and cooperative, no obvious depression or anxiety  ASSESSMENT AND PLAN:  Discussed the following assessment and plan:  Gastroesophageal reflux disease, esophagitis presence not specified  Abdominal pain, epigastric - Plan: CMP, CBC with Differential, Lipase, H. pylori antigen, stool  -we discussed possible serious and likely etiologies, workup and treatment, treatment risks and return precautions -Labs per orders -Try Protonix for this for a few weeks -Lifestyle recommendations -Follow up in 4 weeks, sooner if needed -Patient advised to return or notify a doctor immediately if symptoms worsen or persist or new concerns arise.  Patient Instructions  BEFORE YOU LEAVE: -schedule follow up in 1 month for GERD -labs  Stop the prilosec.  Start the protonix and take once  daily.  Follow the diet guidelines.  -We have ordered labs or studies at this visit. It can take up to 1-2 weeks for results and processing. We will contact you with instructions IF your results are abnormal. Normal results will be released to your [facility]. If you have not heard from [place] or can not find your results in [facility] in 2 weeks please contact our office.  We recommend the following healthy lifestyle measures: - eat a healthy whole foods diet consisting of regular small meals composed of vegetables, fruits, beans, nuts, seeds, healthy meats such as  white chicken and fish and whole grains.  - avoid sweets, white starchy foods, fried foods, fast food, processed foods, sodas, red meet and other fattening foods.  - get a least 150-300 minutes of aerobic exercise per week.   Food Choices for Gastroesophageal Reflux Disease, Adult When you have gastroesophageal reflux disease (GERD), the foods you eat and your eating habits are very important. Choosing the right foods can help ease the discomfort of GERD. WHAT GENERAL GUIDELINES DO I NEED TO FOLLOW?  Choose fruits, vegetables, whole grains, low-fat dairy products, and low-fat meat, fish, and poultry.  Limit fats such as oils, salad dressings, butter, nuts, and avocado.  Keep a food diary to identify foods that cause symptoms.  Avoid foods that cause reflux. These may be different for different people.  Eat frequent small meals instead of three large meals each day.  Eat your meals slowly, in a relaxed setting.  Limit fried foods.  Cook foods using methods other than frying.  Avoid drinking alcohol.  Avoid drinking large amounts of liquids with your meals.  Avoid bending over or lying down until 2-3 hours after eating. WHAT FOODS ARE NOT RECOMMENDED? The following are some foods and drinks that may worsen your symptoms: Vegetables Tomatoes. Tomato juice. Tomato and spaghetti sauce. Chili peppers. Onion and garlic. Horseradish. Fruits Oranges, grapefruit, and lemon (fruit and juice). Meats High-fat meats, fish, and poultry. This includes hot dogs, ribs, ham, sausage, salami, and bacon. Dairy Whole milk and chocolate milk. Sour cream. Cream. Butter. Ice cream. Cream cheese.  Beverages Coffee and tea, with or without caffeine. Carbonated beverages or energy drinks. Condiments Hot sauce. Barbecue sauce.  Sweets/Desserts Chocolate and cocoa. Donuts. Peppermint and spearmint. Fats and Oils High-fat foods, including [place] fries and potato chips. Other Vinegar. Strong spices,  such as black pepper, white pepper, red pepper, cayenne, curry powder, cloves, ginger, and chili powder. The items listed above may not be a complete list of foods and beverages to avoid. Contact your dietitian for more information.   This information is not intended to replace advice given to you by your health care provider. Make sure you discuss any questions you have with your health care provider.   Document Released: 05/24/2005 Document Revised: 06/14/2014 Document Reviewed: 03/28/2013 Elsevier Interactive Patient Education 2016 [place], [place]

## 2015-10-09 NOTE — Patient Instructions (Signed)
BEFORE YOU LEAVE: -schedule follow up in 1 month for GERD -labs  Stop the prilosec.  Start the protonix and take once daily.  Follow the diet guidelines.  -We have ordered labs or studies at this visit. It can take up to 1-2 weeks for results and processing. We will contact you with instructions IF your results are abnormal. Normal results will be released to your [facility]. If you have not heard from [place] or can not find your results in [facility] in 2 weeks please contact our office.  We recommend the following healthy lifestyle measures: - eat a healthy whole foods diet consisting of regular small meals composed of vegetables, fruits, beans, nuts, seeds, healthy meats such as white chicken and fish and whole grains.  - avoid sweets, white starchy foods, fried foods, fast food, processed foods, sodas, red meet and other fattening foods.  - get a least 150-300 minutes of aerobic exercise per week.   Food Choices for Gastroesophageal Reflux Disease, Adult When you have gastroesophageal reflux disease (GERD), the foods you eat and your eating habits are very important. Choosing the right foods can help ease the discomfort of GERD. WHAT GENERAL GUIDELINES DO I NEED TO FOLLOW?  Choose fruits, vegetables, whole grains, low-fat dairy products, and low-fat meat, fish, and poultry.  Limit fats such as oils, salad dressings, butter, nuts, and avocado.  Keep a food diary to identify foods that cause symptoms.  Avoid foods that cause reflux. These may be different for different people.  Eat frequent small meals instead of three large meals each day.  Eat your meals slowly, in a relaxed setting.  Limit fried foods.  Cook foods using methods other than frying.  Avoid drinking alcohol.  Avoid drinking large amounts of liquids with your meals.  Avoid bending over or lying down until 2-3 hours after eating. WHAT FOODS ARE NOT RECOMMENDED? The following are some foods and drinks that may worsen  your symptoms: Vegetables Tomatoes. Tomato juice. Tomato and spaghetti sauce. Chili peppers. Onion and garlic. Horseradish. Fruits Oranges, grapefruit, and lemon (fruit and juice). Meats High-fat meats, fish, and poultry. This includes hot dogs, ribs, ham, sausage, salami, and bacon. Dairy Whole milk and chocolate milk. Sour cream. Cream. Butter. Ice cream. Cream cheese.  Beverages Coffee and tea, with or without caffeine. Carbonated beverages or energy drinks. Condiments Hot sauce. Barbecue sauce.  Sweets/Desserts Chocolate and cocoa. Donuts. Peppermint and spearmint. Fats and Oils High-fat foods, including [place] fries and potato chips. Other Vinegar. Strong spices, such as black pepper, white pepper, red pepper, cayenne, curry powder, cloves, ginger, and chili powder. The items listed above may not be a complete list of foods and beverages to avoid. Contact your dietitian for more information.   This information is not intended to replace advice given to you by your health care provider. Make sure you discuss any questions you have with your health care provider.   Document Released: 05/24/2005 Document Revised: 06/14/2014 Document Reviewed: 03/28/2013 Elsevier Interactive Patient Education [employer].

## 2015-10-10 ENCOUNTER — Telehealth: Payer: Self-pay | Admitting: Family Medicine

## 2015-10-10 NOTE — Telephone Encounter (Signed)
I called the pt and informed her of the message below and she agreed. 

## 2015-10-10 NOTE — Telephone Encounter (Signed)
Please have her find out which medications (PPIs) are covered. In the interim can continue the prilosec 40mg  daily. Thanks.

## 2015-10-10 NOTE — Telephone Encounter (Signed)
Pt returned your call.  

## 2015-10-10 NOTE — Telephone Encounter (Signed)
See result note.  

## 2015-10-10 NOTE — Telephone Encounter (Signed)
Pt need the generic Rx pantoprazole (PROTONIX) 40 mg.  Pharm: [facility]

## 2015-10-10 NOTE — Telephone Encounter (Signed)
I called the pt and she stated her insurance company will not cover Protonix at all-generic or brand per the pharmacist and the cost is $71. Please change to a different medication or call insurance company?

## 2015-10-13 ENCOUNTER — Telehealth: Payer: Self-pay | Admitting: Obstetrics and Gynecology

## 2015-10-13 NOTE — Telephone Encounter (Signed)
Called and left a message for patient to call back to schedule a new patient doctor referral. °

## 2015-10-14 NOTE — Telephone Encounter (Signed)
Patient is returning a call to Starla. °

## 2015-10-14 NOTE — Telephone Encounter (Signed)
Pt said she needs PA for pantoprazole please call [phone]

## 2015-10-16 ENCOUNTER — Telehealth: Payer: Self-pay | Admitting: *Deleted

## 2015-10-16 NOTE — Telephone Encounter (Signed)
Dr Maudie Mercury received the [facility] results from [place], stated the test was negative and I called the pt and left a detailed message with the results at her cell number.

## 2015-10-22 ENCOUNTER — Telehealth: Payer: Self-pay | Admitting: Family Medicine

## 2015-10-22 NOTE — Telephone Encounter (Signed)
PA completed today.  Waiting on response from insurance.

## 2015-10-22 NOTE — Telephone Encounter (Signed)
Pt stated that the medication that she rec'd is not working.  PA has not been rec'd by [facility] and pharmacy has not rec'd anything as well.

## 2015-10-22 NOTE — Telephone Encounter (Signed)
Received a fax from [employer].  Requesting pa on pantoprazole 40 mg to take once daily.  PA submitted on MyChart 10/22/15.  Waiting on a response.

## 2015-10-23 ENCOUNTER — Ambulatory Visit: Payer: BLUE CROSS/BLUE SHIELD | Admitting: Obstetrics and Gynecology

## 2015-10-23 ENCOUNTER — Other Ambulatory Visit (INDEPENDENT_AMBULATORY_CARE_PROVIDER_SITE_OTHER): Payer: BLUE CROSS/BLUE SHIELD

## 2015-10-23 DIAGNOSIS — D649 Anemia, unspecified: Secondary | ICD-10-CM

## 2015-10-23 LAB — CBC WITH DIFFERENTIAL/PLATELET
BASOS ABS: 0 10*3/uL (ref 0.0–0.1)
BASOS PCT: 0.7 % (ref 0.0–3.0)
EOS PCT: 2.5 % (ref 0.0–5.0)
Eosinophils Absolute: 0.1 10*3/uL (ref 0.0–0.7)
HEMATOCRIT: 33.3 % — AB (ref 36.0–46.0)
Hemoglobin: 10.2 g/dL — ABNORMAL LOW (ref 12.0–15.0)
LYMPHS ABS: 1.8 10*3/uL (ref 0.7–4.0)
LYMPHS PCT: 34.7 % (ref 12.0–46.0)
MCHC: 30.7 g/dL (ref 30.0–36.0)
MONOS PCT: 6.3 % (ref 3.0–12.0)
Monocytes Absolute: 0.3 10*3/uL (ref 0.1–1.0)
NEUTROS ABS: 2.8 10*3/uL (ref 1.4–7.7)
NEUTROS PCT: 55.8 % (ref 43.0–77.0)
PLATELETS: 423 10*3/uL — AB (ref 150.0–400.0)
RBC: 4.88 Mil/uL (ref 3.87–5.11)
RDW: 23.4 % — AB (ref 11.5–15.5)
WBC: 5.1 10*3/uL (ref 4.0–10.5)

## 2015-10-23 NOTE — Telephone Encounter (Signed)
I called the pt and informed her of the message below and she stated she will try the OTC medication.  I told her I am not sure how long it can take for the prior authorization.

## 2015-10-23 NOTE — Telephone Encounter (Signed)
While waiting on prior auth, he could try prilosec 40mg  daily if not already doing or ranitidine 150 bid. These are available over the counter. If she keeps having bad symptoms despite - would advise follow up visit to re-eval.

## 2015-10-24 NOTE — Telephone Encounter (Signed)
Received response from insurance.  Coverage has been approved.  Pt notified by telephone.

## 2015-10-30 ENCOUNTER — Ambulatory Visit (INDEPENDENT_AMBULATORY_CARE_PROVIDER_SITE_OTHER): Payer: BLUE CROSS/BLUE SHIELD | Admitting: Obstetrics and Gynecology

## 2015-10-30 ENCOUNTER — Telehealth: Payer: Self-pay | Admitting: Obstetrics and Gynecology

## 2015-10-30 ENCOUNTER — Encounter: Payer: Self-pay | Admitting: Obstetrics and Gynecology

## 2015-10-30 VITALS — BP 110/70 | HR 80 | Resp 16 | Ht 67.0 in | Wt 177.0 lb

## 2015-10-30 DIAGNOSIS — N946 Dysmenorrhea, unspecified: Secondary | ICD-10-CM | POA: Diagnosis not present

## 2015-10-30 DIAGNOSIS — D509 Iron deficiency anemia, unspecified: Secondary | ICD-10-CM | POA: Diagnosis not present

## 2015-10-30 DIAGNOSIS — N920 Excessive and frequent menstruation with regular cycle: Secondary | ICD-10-CM | POA: Diagnosis not present

## 2015-10-30 LAB — TSH: TSH: 0.54 m[IU]/L

## 2015-10-30 LAB — POCT URINE PREGNANCY: Preg Test, Ur: NEGATIVE

## 2015-10-30 MED ORDER — NAPROXEN SODIUM 550 MG PO TABS: ORAL_TABLET | ORAL | Status: DC

## 2015-10-30 NOTE — Telephone Encounter (Signed)
Called patient to review benefits for a recommended procedure. Left Voicemail requesting a call back. °

## 2015-10-30 NOTE — Patient Instructions (Signed)

## 2015-10-30 NOTE — Progress Notes (Signed)
Patient ID: Diamond Hansen, female   DOB: [birth year], 40 y.o.   MRN: [id number] 40 y.o. [id number] SingleAfrican AmericanF sent by Dr Colin Benton for a consultation for heavy menstrual bleeding and anemia. Three weeks ago her hgb was 9.7 gm/dl, 1 week ago, after iron up to 10.2 gm/dl.  Period Cycle (Days): 28 Period Duration (Days): 5 days  Period Pattern: Regular Menstrual Flow: Heavy, Moderate Menstrual Control: Maxi pad Menstrual Control Change Freq (Hours): on heavy days changes pad every 30 - 60 mins  Dysmenorrhea: (!) Moderate Dysmenorrhea Symptoms: Cramping  Her cycles have been heavy for a long time, a little heavier in the last year. Cramps have worsened some, tolerable, helped with ibuprofen (up to 800 mg). Her very heavy bleeding for 2.5 days. Occasional random spotting. Not sexually active for the last 2 months.  Patient's last menstrual period was 10/23/2015.          Sexually active: No.  The current method of family planning is tubal ligation.    Exercising: Yes.    occassionally Smoker:  Former smoker  Health Maintenance: Pap:  05-26-15 WNL NEG HR HPV History of abnormal Pap:  Yes + HPV  MMG:  Never Colonoscopy:  Never BMD:   Never TDaP:  05-16-14 Gardasil: N/A   reports that she has quit smoking. She has never used smokeless tobacco. She reports that she drinks alcohol. She reports that she does not use illicit drugs.She is a Teacher, English as a foreign language at [employer]. 3 Daughters are 106,18 and 83  Past Medical History  Diagnosis Date  . GERD (gastroesophageal reflux disease)   . Anemia     Past Surgical History  Procedure Laterality Date  . Tubal ligation    . Umbilical hernia repair      age 82    Current Outpatient Prescriptions  Medication Sig Dispense Refill  . Omeprazole (PRILOSEC PO) Take by mouth.    . pantoprazole (PROTONIX) 40 MG tablet Take 1 tablet (40 mg total) by mouth daily. (Patient not taking: Reported on 10/30/2015) 30 tablet 0   No current  facility-administered medications for this visit.    Family History  Problem Relation Age of Onset  . Hypertension Father   . Diabetes Brother     Review of Systems  Constitutional: Negative.   HENT: Negative.   Eyes: Negative.   Respiratory: Negative.   Cardiovascular: Negative.   Gastrointestinal: Negative.   Endocrine: Negative.   Genitourinary: Positive for menstrual problem.       Heavy menstrual cycles   Musculoskeletal: Negative.   Skin: Negative.   Allergic/Immunologic: Negative.   Neurological: Negative.   Psychiatric/Behavioral: Negative.     Exam:   BP 110/70 mmHg  Pulse 80  Resp 16  Ht 5\' 7"  (1.702 m)  Wt 177 lb (80.287 kg)  BMI 27.72 kg/m2  LMP 10/23/2015  Weight change: @WEIGHTCHANGE @ Height:   Height: 5\' 7"  (170.2 cm)  Ht Readings from Last 3 Encounters:  10/30/15 5\' 7"  (1.702 m)  10/09/15 5' 7.25" (1.708 m)  06/05/15 5' 7.25" (1.708 m)    General appearance: alert, cooperative and appears stated age Head: Normocephalic, without obvious abnormality, atraumatic Neck: no adenopathy, supple, symmetrical, trachea midline and thyroid normal to inspection and palpation Lungs: clear to auscultation bilaterally Heart: regular rate and rhythm Abdomen: soft, non-tender; bowel sounds normal; no masses,  no organomegaly Extremities: extremities normal, atraumatic, no cyanosis or edema Skin: Skin color, texture, turgor normal. No rashes or lesions Lymph nodes: Cervical, supraclavicular, and axillary nodes  normal. No abnormal inguinal nodes palpated Neurologic: Grossly normal   Pelvic: External genitalia:  no lesions              Urethra:  normal appearing urethra with no masses, tenderness or lesions              Bartholins and Skenes: normal                 Vagina: normal appearing vagina with normal color and discharge, no lesions              Cervix: no lesions               Bimanual Exam:  Uterus:  normal size, contour, position, consistency, mobility,  non-tender and retroverted              Adnexa: no mass, fullness, tenderness               Rectovaginal: Confirms               Anus:  normal sphincter tone, no lesions  Chaperone was present for exam.  A:  Menorrhagia  Anemia  Dysmenorrhea  P:   TSH  UPT, negative  Return for a GYN ultrasound, possible sonohysterogram  Discussed possible treatment with OCP's (no contraindication, risks reviewed) or the mirena IUD  Anaprox for cramps and menorrhagia  CC: Dr Colin Benton Letter sent

## 2015-10-31 NOTE — Telephone Encounter (Signed)
Called patient to review benefits for a recommended procedure. Left Voicemail requesting a call back. °

## 2015-11-05 ENCOUNTER — Telehealth: Payer: Self-pay

## 2015-11-05 NOTE — Telephone Encounter (Signed)
Spoke with patient. Advised of all normal results. She is agreeable and verbalizes understanding.  Routing to provider for final review. Patient agreeable to disposition. Will close encounter.

## 2015-11-05 NOTE — Progress Notes (Signed)
Pantoprazole Sodium- Approved effective: 10/03/15 through 10/23/2016.

## 2015-11-05 NOTE — Telephone Encounter (Signed)
-----   Message from Salvadore Dom, MD sent at 10/30/2015 10:00 PM EDT ----- Please advise the patient of normal results.

## 2015-11-06 ENCOUNTER — Ambulatory Visit: Payer: BLUE CROSS/BLUE SHIELD | Admitting: Family Medicine

## 2015-11-10 NOTE — Telephone Encounter (Signed)
Called patient to review benefits for a recommended procedure. Left Voicemail requesting a call back. °

## 2015-11-12 NOTE — Telephone Encounter (Signed)
Called patient to review benefits for a recommended procedure. Left Voicemail requesting a call back. °

## 2015-11-13 ENCOUNTER — Encounter: Payer: Self-pay | Admitting: Family Medicine

## 2015-11-13 ENCOUNTER — Ambulatory Visit (INDEPENDENT_AMBULATORY_CARE_PROVIDER_SITE_OTHER): Payer: BLUE CROSS/BLUE SHIELD | Admitting: Family Medicine

## 2015-11-13 VITALS — BP 102/80 | HR 85 | Temp 98.4°F | Ht 67.0 in | Wt 175.7 lb

## 2015-11-13 DIAGNOSIS — N921 Excessive and frequent menstruation with irregular cycle: Secondary | ICD-10-CM | POA: Diagnosis not present

## 2015-11-13 DIAGNOSIS — D649 Anemia, unspecified: Secondary | ICD-10-CM

## 2015-11-13 DIAGNOSIS — K219 Gastro-esophageal reflux disease without esophagitis: Secondary | ICD-10-CM

## 2015-11-13 NOTE — Progress Notes (Signed)
Pre visit review using our clinic review tool, if applicable. No additional management support is needed unless otherwise documented below in the visit note. 

## 2015-11-13 NOTE — Progress Notes (Signed)
HPI:  Follow up:  GERD: -using PPI, pantoprazole 40mg , required prior auth -symptoms have resolved and she is feeling "a lot better" -denies: persistent acid reflux, pain, wt loss, melena, hematochezia  Anemia: -likely 2ndary to heavy menstrual bleeding -she saw gyn and reports, needs to do sonogram -continues to have intramenstrual bleeding and heavy bleeding -taking iron therapy   ROS: See pertinent positives and negatives per HPI.  Past Medical History  Diagnosis Date  . GERD (gastroesophageal reflux disease)   . Anemia     Past Surgical History  Procedure Laterality Date  . Tubal ligation    . Umbilical hernia repair      age 40    Family History  Problem Relation Age of Onset  . Hypertension Father   . Diabetes Brother     Social History   Social History  . Marital Status: Single    Spouse Name: N/A  . Number of Children: N/A  . Years of Education: N/A   Social History Main Topics  . Smoking status: Former Research scientist (life sciences)  . Smokeless tobacco: Never Used     Comment: quit in 2011, smoke on and off for 8 years  . Alcohol Use: 0.0 oz/week    0 Standard drinks or equivalent per week     Comment: occ  . Drug Use: No  . Sexual Activity: No   Other Topics Concern  . None   Social History Narrative   Work or School: works at [employer] Situation: lives with 3 daughter (11, 17, 38)      Spiritual Beliefs: none      Lifestyle: walks a few days per week; diet is so so              Current outpatient prescriptions:  .  naproxen sodium (ANAPROX DS) 550 MG tablet, 1 tab po q 12 hours prn, Disp: 30 tablet, Rfl: 2 .  pantoprazole (PROTONIX) 40 MG tablet, Take 1 tablet (40 mg total) by mouth daily., Disp: 30 tablet, Rfl: 0  EXAM:  Filed Vitals:   11/13/15 0851  BP: 102/80  Pulse: 85  Temp: 98.4 F (36.9 C)    Body mass index is 27.51 kg/(m^2).  GENERAL: vitals reviewed and listed above, alert, oriented, appears well hydrated and in no acute  distress  HEENT: atraumatic, conjunttiva clear, no obvious abnormalities on inspection of external nose and ears  NECK: no obvious masses on inspection  LUNGS: clear to auscultation bilaterally, no wheezes, rales or rhonchi, good air movement  CV: HRRR, no peripheral edema  MS: moves all extremities without noticeable abnormality  PSYCH: pleasant and cooperative, no obvious depression or anxiety  ASSESSMENT AND PLAN:  Discussed the following assessment and plan:  Gastroesophageal reflux disease, esophagitis presence not specified  Anemia, unspecified anemia type - Plan: CBC with Differential/Platelets  Menorrhagia with irregular cycle  -advised to complete workup for abnormal and heavy menstrual bleeding, cont iron -reduce ppi and follow up if symptoms recur -cbc in 2 months -discussed various causes of anemia, in her case likely from the menstrual bleeding, but if does not resolve with tx of this will need GI eval -Patient advised to return or notify a doctor immediately if symptoms worsen or persist or new concerns arise.  Patient Instructions  BEFORE YOU LEAVE: Follow up in about 4-6 months Schedule lab visit in 2 months to recheck blood counts  Try to go down to 20mg  daily of the pantoprazole now that you  are doing well.  Complete workup for the menstrual bleeding with your gynecologist.  Keep taking the iron therapy       Taygan Connell R.

## 2015-11-13 NOTE — Patient Instructions (Signed)
BEFORE YOU LEAVE: Follow up in about 4-6 months Schedule lab visit in 2 months to recheck blood counts  Try to go down to 20mg  daily of the pantoprazole now that you are doing well.  Complete workup for the menstrual bleeding with your gynecologist.  Keep taking the iron therapy

## 2015-12-01 ENCOUNTER — Telehealth: Payer: Self-pay

## 2015-12-01 DIAGNOSIS — N946 Dysmenorrhea, unspecified: Secondary | ICD-10-CM

## 2015-12-01 DIAGNOSIS — N920 Excessive and frequent menstruation with regular cycle: Secondary | ICD-10-CM

## 2015-12-01 NOTE — Telephone Encounter (Signed)
-----   Message from Elenore Paddy sent at 11/24/2015  3:49 PM EDT ----- Regarding: orders Kaitlyn,  Orders expired due to length of time attempting to schedule patient. Please enter orders to link to her appointment scheduled for 12/16/15.  Thank you!  Becky

## 2015-12-01 NOTE — Telephone Encounter (Signed)
Order for PUS for evaluation of menorrhagia and dysmenorrhea placed and linked to appointment on 12/16/2015 with Dr.Jertson.  Routing to provider for final review. Patient agreeable to disposition. Will close encounter.

## 2015-12-04 ENCOUNTER — Ambulatory Visit: Payer: BLUE CROSS/BLUE SHIELD | Admitting: Family Medicine

## 2015-12-15 ENCOUNTER — Telehealth: Payer: Self-pay | Admitting: Obstetrics and Gynecology

## 2015-12-15 NOTE — Telephone Encounter (Signed)
Patient needs to reschedule her PUS/Consult appointment tomorrow. Patient is aware of cancellation without 72 hours notice policy.

## 2015-12-15 NOTE — Telephone Encounter (Signed)
Spoke with patient. She was unable to obtain time off of work for her ultrasound appointment.  She is "feeling about the same."  Her only day off is Thursdays and Sundays.  She is rescheduled for 12/25/15 at 11:00 with Dr. Talbert Nan.  She is advised to call back with any concerns prior to appointment and patient is agreeable.   cc Lerry Liner  Routing to provider for final review. Patient agreeable to disposition. Will close encounter.

## 2015-12-16 ENCOUNTER — Other Ambulatory Visit: Payer: BLUE CROSS/BLUE SHIELD

## 2015-12-16 ENCOUNTER — Other Ambulatory Visit: Payer: BLUE CROSS/BLUE SHIELD | Admitting: Obstetrics and Gynecology

## 2015-12-18 ENCOUNTER — Telehealth: Payer: Self-pay | Admitting: Family Medicine

## 2015-12-18 NOTE — Telephone Encounter (Signed)
Pt need new Rx for pantoprazole   Pharm:  [facility]

## 2015-12-19 MED ORDER — PANTOPRAZOLE SODIUM 20 MG PO TBEC: 20.0000 mg | DELAYED_RELEASE_TABLET | Freq: Every day | ORAL | Status: DC

## 2015-12-22 ENCOUNTER — Telehealth: Payer: Self-pay | Admitting: Obstetrics and Gynecology

## 2015-12-22 NOTE — Telephone Encounter (Signed)
Left message to call Diamond Hansen at [phone]. 

## 2015-12-22 NOTE — Telephone Encounter (Signed)
Patient canceled her upcoming PUS/Consult appointment 12/25/15. Patient would like to reschedule to 01/01/16.

## 2015-12-25 ENCOUNTER — Other Ambulatory Visit: Payer: BLUE CROSS/BLUE SHIELD

## 2015-12-25 ENCOUNTER — Other Ambulatory Visit: Payer: BLUE CROSS/BLUE SHIELD | Admitting: Obstetrics and Gynecology

## 2015-12-25 NOTE — Telephone Encounter (Signed)
Spoke with patient. Patient would like to reschedule her PUS at this time. PUS rescheduled for 01/01/2016 at 9:30 am with 10 am consult with Dr.Jertson. Patient has billing questions. Advised I will send a message to our practice administrator regarding billing for return call. She is agreeable.

## 2015-12-25 NOTE — Telephone Encounter (Signed)
Left message to call Kaitlyn at [phone]. 

## 2015-12-29 NOTE — Telephone Encounter (Signed)
Patient returned call. She is calling to check on status of billing questions. Advised I will send a message to our billing department as well as to our practice administrator for return call. She is agreeable.  Cc: Raymon Mutton

## 2016-01-01 ENCOUNTER — Ambulatory Visit (INDEPENDENT_AMBULATORY_CARE_PROVIDER_SITE_OTHER): Payer: BLUE CROSS/BLUE SHIELD

## 2016-01-01 ENCOUNTER — Ambulatory Visit (INDEPENDENT_AMBULATORY_CARE_PROVIDER_SITE_OTHER): Payer: BLUE CROSS/BLUE SHIELD | Admitting: Obstetrics and Gynecology

## 2016-01-01 ENCOUNTER — Telehealth: Payer: Self-pay | Admitting: Obstetrics and Gynecology

## 2016-01-01 ENCOUNTER — Encounter: Payer: Self-pay | Admitting: Obstetrics and Gynecology

## 2016-01-01 VITALS — BP 128/80 | HR 76 | Resp 14 | Wt 175.0 lb

## 2016-01-01 DIAGNOSIS — D259 Leiomyoma of uterus, unspecified: Secondary | ICD-10-CM

## 2016-01-01 DIAGNOSIS — N946 Dysmenorrhea, unspecified: Secondary | ICD-10-CM | POA: Diagnosis not present

## 2016-01-01 DIAGNOSIS — N920 Excessive and frequent menstruation with regular cycle: Secondary | ICD-10-CM

## 2016-01-01 DIAGNOSIS — Z862 Personal history of diseases of the blood and blood-forming organs and certain disorders involving the immune mechanism: Secondary | ICD-10-CM | POA: Diagnosis not present

## 2016-01-01 LAB — CBC
HCT: 40.8 % (ref 35.0–45.0)
Hemoglobin: 12.7 g/dL (ref 11.7–15.5)
MCH: 24.1 pg — ABNORMAL LOW (ref 27.0–33.0)
MCHC: 31.1 g/dL — ABNORMAL LOW (ref 32.0–36.0)
MCV: 77.6 fL — ABNORMAL LOW (ref 80.0–100.0)
MPV: 9.4 fL (ref 7.5–12.5)
Platelets: 420 10*3/uL — ABNORMAL HIGH (ref 140–400)
RBC: 5.26 MIL/uL — ABNORMAL HIGH (ref 3.80–5.10)
RDW: 20.6 % — ABNORMAL HIGH (ref 11.0–15.0)
WBC: 7.8 10*3/uL (ref 3.8–10.8)

## 2016-01-01 LAB — FERRITIN: Ferritin: 20 ng/mL (ref 10–154)

## 2016-01-01 MED ORDER — NORETHIN ACE-ETH ESTRAD-FE 1-20 MG-MCG PO TABS: 1.0000 | ORAL_TABLET | Freq: Every day | ORAL | 0 refills | Status: DC

## 2016-01-01 NOTE — Progress Notes (Signed)
GYNECOLOGY  VISIT   HPI: 40 y.o.   Single  African American  female   [phone] with Patient's last menstrual period was 12/08/2015.   here for menorrhagia and pelvic U/S Menses q month x 5 days, saturates a pad in 30-60 minutes at times. Cramps off and on. She has occasional BTB, not sure if mid cycle. Not recently.  Her cycle has been this heavy for the last 6 months.  She occasionally gets light headed with her cycle. She is on iron qd.   Not currently sexually active.   GYNECOLOGIC HISTORY: Patient's last menstrual period was 12/08/2015. Contraception:Tubaligation  Menopausal hormone therapy: none        OB History    Gravida Para Term Preterm AB Living   3 3 3     3    SAB TAB Ectopic Multiple Live Births           3         Patient Active Problem List   Diagnosis Date Noted  . Bacterial vaginitis, recurrent 01/08/2014  . GERD (gastroesophageal reflux disease) 01/08/2014    Past Medical History:  Diagnosis Date  . Anemia   . GERD (gastroesophageal reflux disease)     Past Surgical History:  Procedure Laterality Date  . TUBAL LIGATION    . UMBILICAL HERNIA REPAIR     age 46    Current Outpatient Prescriptions  Medication Sig Dispense Refill  . naproxen sodium (ANAPROX DS) 550 MG tablet 1 tab po q 12 hours prn 30 tablet 2  . pantoprazole (PROTONIX) 20 MG tablet Take 1 tablet (20 mg total) by mouth daily. 90 tablet 0   No current facility-administered medications for this visit.      ALLERGIES: Review of patient's allergies indicates no known allergies.  Family History  Problem Relation Age of Onset  . Hypertension Father   . Diabetes Brother     Social History   Social History  . Marital status: Single    Spouse name: N/A  . Number of children: N/A  . Years of education: N/A   Occupational History  . Not on file.   Social History Main Topics  . Smoking status: Former Research scientist (life sciences)  . Smokeless tobacco: Never Used     Comment: quit in 2011, smoke on and  off for 8 years  . Alcohol use 0.0 oz/week     Comment: occ  . Drug use: No  . Sexual activity: No   Other Topics Concern  . Not on file   Social History Narrative   Work or School: works at [employer] Situation: lives with 3 daughter (44, 20, 10)      Spiritual Beliefs: none      Lifestyle: walks a few days per week; diet is so so             Review of Systems  Constitutional: Negative.   HENT: Negative.   Eyes: Negative.   Respiratory: Negative.   Cardiovascular: Negative.   Gastrointestinal: Negative.   Genitourinary: Negative.   Musculoskeletal: Negative.   Skin: Negative.   Neurological: Negative.   Endo/Heme/Allergies: Negative.   Psychiatric/Behavioral: Negative.     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 If her bleeding doesn't improve, would do a biopsy  CBC, Ferritin today   An After Visit Summary was printed and given to the patient.  Over 15 minutes face to face time of which over 50% was spent in counseling.   CC: Dr Colin Benton

## 2016-01-01 NOTE — Patient Instructions (Addendum)
Oral Contraception Information Oral contraceptive pills (OCPs) are medicines taken to prevent pregnancy. OCPs work by preventing the ovaries from releasing eggs. The hormones in OCPs also cause the cervical mucus to thicken, preventing the sperm from entering the uterus. The hormones also cause the uterine lining to become thin, not allowing a fertilized egg to attach to the inside of the uterus. OCPs are highly effective when taken exactly as prescribed. However, OCPs do not prevent sexually transmitted diseases (STDs). Safe sex practices, such as using condoms along with the pill, can help prevent STDs.  Before taking the pill, you may have a physical exam and Pap test. Your health care provider may order blood tests. The health care provider will make sure you are a good candidate for oral contraception. Discuss with your health care provider the possible side effects of the OCP you may be prescribed. When starting an OCP, it can take 2 to 3 months for the body to adjust to the changes in hormone levels in your body.  TYPES OF ORAL CONTRACEPTION  The combination pill--This pill contains estrogen and progestin (synthetic progesterone) hormones. The combination pill comes in 21-day, 28-day, or 91-day packs. Some types of combination pills are meant to be taken continuously (365-day pills). With 21-day packs, you do not take pills for 7 days after the last pill. With 28-day packs, the pill is taken every day. The last 7 pills are without hormones. Certain types of pills have more than 21 hormone-containing pills. With 91-day packs, the first 84 pills contain both hormones, and the last 7 pills contain no hormones or contain estrogen only.  The minipill--This pill contains the progesterone hormone only. The pill is taken every day continuously. It is very important to take the pill at the same time each day. The minipill comes in packs of 28 pills. All 28 pills contain the hormone.  ADVANTAGES OF ORAL  CONTRACEPTIVE PILLS  Decreases premenstrual symptoms.   Treats menstrual period cramps.   Regulates the menstrual cycle.   Decreases a heavy menstrual flow.   May treatacne, depending on the type of pill.   Treats abnormal uterine bleeding.   Treats polycystic ovarian syndrome.   Treats endometriosis.   Can be used as emergency contraception.  THINGS THAT CAN MAKE ORAL CONTRACEPTIVE PILLS LESS EFFECTIVE OCPs can be less effective if:   You forget to take the pill at the same time every day.   You have a stomach or intestinal disease that lessens the absorption of the pill.   You take OCPs with other medicines that make OCPs less effective, such as antibiotics, certain HIV medicines, and some seizure medicines.   You take expired OCPs.   You forget to restart the pill on day 7, when using the packs of 21 pills.  RISKS ASSOCIATED WITH ORAL CONTRACEPTIVE PILLS  Oral contraceptive pills can sometimes cause side effects, such as:  Headache.  Nausea.  Breast tenderness.  Irregular bleeding or spotting. Combination pills are also associated with a small increased risk of:  Blood clots.  Heart attack.  Stroke.   This information is not intended to replace advice given to you by your health care provider. Make sure you discuss any questions you have with your health care provider.   Document Released: 08/14/2002 Document Revised: 03/14/2013 Document Reviewed: 11/12/2012 Elsevier Interactive Patient Education 2016 Elsevier Inc.  Uterine Fibroids Uterine fibroids are tissue masses (tumors) that can develop in the womb (uterus). They are also called leiomyomas. This type  of tumor is not cancerous (benign) and does not spread to other parts of the body outside of the pelvic area, which is between the hip bones. Occasionally, fibroids may develop in the fallopian tubes, in the cervix, or on the support structures (ligaments) that surround the uterus. You can  have one or many fibroids. Fibroids can vary in size, weight, and where they grow in the uterus. Some can become quite large. Most fibroids do not require medical treatment. CAUSES A fibroid can develop when a single uterine cell keeps growing (replicating). Most cells in the human body have a control mechanism that keeps them from replicating without control. SIGNS AND SYMPTOMS Symptoms may include:   Heavy bleeding during your period.  Bleeding or spotting between periods.  Pelvic pain and pressure.  Bladder problems, such as needing to urinate more often (urinary frequency) or urgently.  Inability to reproduce offspring (infertility).  Miscarriages. DIAGNOSIS Uterine fibroids are diagnosed through a physical exam. Your health care provider may feel the lumpy tumors during a pelvic exam. Ultrasonography and an MRI may be done to determine the size, location, and number of fibroids. TREATMENT Treatment may include:  Watchful waiting. This involves getting the fibroid checked by your health care provider to see if it grows or shrinks. Follow your health care provider's recommendations for how often to have this checked.  Hormone medicines. These can be taken by mouth or given through an intrauterine device (IUD).  Surgery.  Removing the fibroids (myomectomy) or the uterus (hysterectomy).  Removing blood supply to the fibroids (uterine artery embolization). If fibroids interfere with your fertility and you want to become pregnant, your health care provider may recommend having the fibroids removed.  HOME CARE INSTRUCTIONS  Keep all follow-up visits as directed by your health care provider. This is important.  Take medicines only as directed by your health care provider.  If you were prescribed a hormone treatment, take the hormone medicines exactly as directed.  Do not take aspirin, because it can cause bleeding.  Ask your health care provider about taking iron pills and  increasing the amount of dark green, leafy vegetables in your diet. These actions can help to boost your blood iron levels, which may be affected by heavy menstrual bleeding.  Pay close attention to your period and tell your health care provider about any changes, such as:  Increased blood flow that requires you to use more pads or tampons than usual per month.  A change in the number of days that your period lasts per month.  A change in symptoms that are associated with your period, such as abdominal cramping or back pain. SEEK MEDICAL CARE IF:  You have pelvic pain, back pain, or abdominal cramps that cannot be controlled with medicines.  You have an increase in bleeding between and during periods.  You soak tampons or pads in a half hour or less.  You feel lightheaded, extra tired, or weak. SEEK IMMEDIATE MEDICAL CARE IF:  You faint.  You have a sudden increase in pelvic pain.   This information is not intended to replace advice given to you by your health care provider. Make sure you discuss any questions you have with your health care provider.   Document Released: 05/21/2000 Document Revised: 06/14/2014 Document Reviewed: 11/20/2013 Elsevier Interactive Patient Education [employer].

## 2016-01-01 NOTE — Telephone Encounter (Signed)
Patient seen today. Patient is at the pharmacy and was told the pharmacy has some questions about the prescription sent today by Dr.Jertson.

## 2016-01-01 NOTE — Telephone Encounter (Signed)
Spoke with Oley Balm at [facility] regarding patient's rx. Need to verify if order is for Loestrin 1-20 or Loestrin Fe 1-20. Advised rx sent in today is for Loestrin Fe 1-20 #3 0RF. Oley Balm is agreeable and will process rx.  Left message for patient to call [facility] at [phone].

## 2016-01-05 ENCOUNTER — Telehealth: Payer: Self-pay | Admitting: Family Medicine

## 2016-01-05 NOTE — Telephone Encounter (Signed)
Can you check with pharmacy - I am not sure that this can be cut. If ok to cut ok to send per pt request. Otherwise let her know. Thanks.

## 2016-01-05 NOTE — Telephone Encounter (Signed)
Pt needs a new rx pantoprazole 40 mg instead of 20  Mg. Pt ins will only pay for 40  Mg. Pt will cut in half #30 w/refills sent to [facility]

## 2016-01-05 NOTE — Telephone Encounter (Signed)
Left detailed message at number provided, [phone], okay per ROI. Advised I have spoken with [facility] pharmacy and medication has been verified. Advised to return call to the office if she has any trouble filling this rx or if she has any further questions.  Routing to provider for final review. Patient agreeable to disposition. Will close encounter.

## 2016-01-06 ENCOUNTER — Telehealth: Payer: Self-pay | Admitting: *Deleted

## 2016-01-06 MED ORDER — PANTOPRAZOLE SODIUM 40 MG PO TBEC: DELAYED_RELEASE_TABLET | ORAL | 3 refills | Status: DC

## 2016-01-06 NOTE — Telephone Encounter (Signed)
I called the pharmacy and spoke with Festus Holts the pharmacist and she stated the pts insurance has denied the [facility] for the 20mg  tablet.  Festus Holts stated the pt can cut this in half and the new Rx for Protonix 40mg  was sent in and I called the pt and left a detailed message with this information at her cell number.

## 2016-01-06 NOTE — Telephone Encounter (Signed)
I left a message for the pt to return my call. 

## 2016-01-06 NOTE — Addendum Note (Signed)
Addended by: Agnes Lawrence on: 01/06/2016 01:27 PM   Modules accepted: Orders

## 2016-01-06 NOTE — Telephone Encounter (Signed)
Festus Holts pharmacist with [facility] called back and stated she told me wrong as the Protonix 40mg  is an extended release pill and cannot be cut in half.  Message forwarded to Dr Maudie Mercury.

## 2016-01-06 NOTE — Telephone Encounter (Signed)
Please let pt know

## 2016-01-15 ENCOUNTER — Other Ambulatory Visit (INDEPENDENT_AMBULATORY_CARE_PROVIDER_SITE_OTHER): Payer: BLUE CROSS/BLUE SHIELD

## 2016-01-15 DIAGNOSIS — D649 Anemia, unspecified: Secondary | ICD-10-CM

## 2016-01-15 LAB — CBC WITH DIFFERENTIAL/PLATELET
Basophils Absolute: 0 10*3/uL (ref 0.0–0.1)
Basophils Relative: 0.6 % (ref 0.0–3.0)
Eosinophils Absolute: 0.2 10*3/uL (ref 0.0–0.7)
Eosinophils Relative: 2.3 % (ref 0.0–5.0)
HCT: 36.6 % (ref 36.0–46.0)
Hemoglobin: 11.9 g/dL — ABNORMAL LOW (ref 12.0–15.0)
Lymphocytes Relative: 34 % (ref 12.0–46.0)
Lymphs Abs: 2.3 10*3/uL (ref 0.7–4.0)
MCHC: 32.5 g/dL (ref 30.0–36.0)
MCV: 77.9 fl — ABNORMAL LOW (ref 78.0–100.0)
Monocytes Absolute: 0.5 10*3/uL (ref 0.1–1.0)
Monocytes Relative: 7.5 % (ref 3.0–12.0)
Neutro Abs: 3.8 10*3/uL (ref 1.4–7.7)
Neutrophils Relative %: 55.6 % (ref 43.0–77.0)
Platelets: 400 10*3/uL (ref 150.0–400.0)
RBC: 4.69 Mil/uL (ref 3.87–5.11)
RDW: 19.6 % — ABNORMAL HIGH (ref 11.5–15.5)
WBC: 6.8 10*3/uL (ref 4.0–10.5)

## 2016-01-15 NOTE — Telephone Encounter (Signed)
I left a message for the pt to return my call. 

## 2016-01-30 NOTE — Telephone Encounter (Signed)
Patient called back and I informed her of the message below.  She stated she did not get the Rx that was sent in on 8/1 and has not used any medication for the past 3 weeks.  Stated she did feel better while taking the Protonix 20mg  and questioned what to do now as this did help her?  Message forwarded to Dr Maudie Mercury and the pt is aware this will be addressed on Monday.

## 2016-02-02 MED ORDER — PANTOPRAZOLE SODIUM 20 MG PO TBEC: 20.0000 mg | DELAYED_RELEASE_TABLET | Freq: Every day | ORAL | 5 refills | Status: DC

## 2016-02-02 NOTE — Addendum Note (Signed)
Addended by: Agnes Lawrence on: 02/02/2016 04:47 PM   Modules accepted: Orders

## 2016-02-02 NOTE — Telephone Encounter (Signed)
Ok to send rx for 20mg  if ok with her. Not ok to use higher dose long term. Consider the rx card. Or she can use other meds approved by her insurance company. Thanks.

## 2016-02-02 NOTE — Telephone Encounter (Signed)
I called the pt and informed her per Dr Maudie Mercury she can try taking the Rx for the Protonix 20mg  to a different pharmacy with the Good Rx card to see if she can get this cheaper, try an over the counter medication or call the insurance company to see what low dose medication like this is covered as she should not take a 40mg  tablet.  Patient stated she has tried OTC medications with no relief and will take a written Rx and shop around for a cheaper price. She is aware the Rx was mailed to her home address with the [facility] Rx card.

## 2016-03-18 ENCOUNTER — Ambulatory Visit: Payer: BLUE CROSS/BLUE SHIELD | Admitting: Family Medicine

## 2016-03-24 NOTE — Progress Notes (Signed)
HPI:  New issue of - desired STI testing: -new partner without protection 2 weeks ago -mild dysuria occ -denies: known exposure to STI, high risk partner, discharge, abd pain, pelvic pain, fevers, malaise -FDLMP: 1 week - still with some bleeding -sees gyn for this  GERD: -using PPI, pantoprazole 20mg  -stable -denies: persistent acid reflux, pain, wt loss, melena, hematochezia  Anemia: -likely 2ndary to heavy menstrual bleeding -seeing gyn; had labs 12/2015 with resolution anemia per gyn notes -reports doing well  Flu vaccine? declined  ROS: See pertinent positives and negatives per HPI.  Past Medical History:  Diagnosis Date  . Anemia   . GERD (gastroesophageal reflux disease)     Past Surgical History:  Procedure Laterality Date  . TUBAL LIGATION    . UMBILICAL HERNIA REPAIR     age 39    Family History  Problem Relation Age of Onset  . Hypertension Father   . Diabetes Brother     Social History   Social History  . Marital status: Single    Spouse name: N/A  . Number of children: N/A  . Years of education: N/A   Social History Main Topics  . Smoking status: Former Research scientist (life sciences)  . Smokeless tobacco: Never Used     Comment: quit in 2011, smoke on and off for 8 years  . Alcohol use 0.0 oz/week     Comment: occ  . Drug use: No  . Sexual activity: No   Other Topics Concern  . Not on file   Social History Narrative   Work or School: works at [employer] Situation: lives with 3 daughter (73, 56, 33)      Spiritual Beliefs: none      Lifestyle: walks a few days per week; diet is so so              Current Outpatient Prescriptions:  .  Ferrous Sulfate (IRON) 325 (65 Fe) MG TABS, Take by mouth., Disp: , Rfl:  .  naproxen sodium (ANAPROX DS) 550 MG tablet, 1 tab po q 12 hours prn, Disp: 30 tablet, Rfl: 2 .  norethindrone-ethinyl estradiol (JUNEL FE,GILDESS FE,LOESTRIN FE) 1-20 MG-MCG tablet, Take 1 tablet by mouth daily., Disp: 3 Package, Rfl:  0 .  pantoprazole (PROTONIX) 20 MG tablet, Take 1 tablet (20 mg total) by mouth daily. (Patient not taking: Reported on 03/25/2016), Disp: 30 tablet, Rfl: 5  EXAM:  Vitals:   03/25/16 1038  BP: 112/76  Pulse: 99  Resp: 16  Temp: 98.3 F (36.8 C)    Body mass index is 27.58 kg/m.  GENERAL: vitals reviewed and listed above, alert, oriented, appears well hydrated and in no acute distress  HEENT: atraumatic, conjunttiva clear, no obvious abnormalities on inspection of external nose and ears  NECK: no obvious masses on inspection  LUNGS: clear to auscultation bilaterally, no wheezes, rales or rhonchi, good air movement  CV: HRRR, no peripheral edema  GU: normal external and speculum exam except for menstrual bleeding, no lesions/discharge/CMT  MS: moves all extremities without noticeable abnormality  PSYCH: pleasant and cooperative, no obvious depression or anxiety  ASSESSMENT AND PLAN:  Discussed the following assessment and plan:  Routine screening for STI (sexually transmitted infection)  Dysuria  Gastroesophageal reflux disease, esophagitis presence not specified  -labs/sti screening per her request/concerns -declined symptoms or known exposures - safe sexual practices advised -udip and reflex for dysuria and follow up if persists -healthy lifestyle advised -Patient advised to return  or notify a doctor immediately if symptoms worsen or persist or new concerns arise.  Patient Instructions  BEFORE YOU LEAVE: -follow up: yearly and as needed -labs -urine dip with reflex culture if needed -STI screening  Follow up if symptoms worsen or persist or new concerns arise.   We recommend the following healthy lifestyle for LIFE:  Eat a healthy clean diet.  * Tip: Avoid (less then 1 serving per week): processed foods, sweets, sweetened drinks, white starches (rice, flour, bread, potatoes, pasta, etc), red meat, fast foods, butter  *Tip: CHOOSE instead   * 5-9  servings per day of fresh or frozen fruits and vegetables (but not corn, potatoes, bananas, canned or dried fruit)   *nuts and seeds, beans   *olives and olive oil   *small portions of lean meats such as fish and white chicken    *small portions of whole grains  Get at least 150 minutes of sweaty aerobic exercise per week.  Reduce stress - consider counseling, meditation and relaxation to balance other aspects of your life.    Colin Benton R., DO

## 2016-03-25 ENCOUNTER — Other Ambulatory Visit (HOSPITAL_COMMUNITY)
Admission: RE | Admit: 2016-03-25 | Discharge: 2016-03-25 | Disposition: A | Discharge status: Home / Self Care | Payer: BLUE CROSS/BLUE SHIELD | Source: Ambulatory Visit | Attending: Family Medicine | Admitting: Family Medicine

## 2016-03-25 ENCOUNTER — Ambulatory Visit (INDEPENDENT_AMBULATORY_CARE_PROVIDER_SITE_OTHER): Payer: BLUE CROSS/BLUE SHIELD | Admitting: Family Medicine

## 2016-03-25 VITALS — BP 112/76 | HR 99 | Temp 98.3°F | Resp 16 | Ht 67.0 in | Wt 176.1 lb

## 2016-03-25 DIAGNOSIS — N760 Acute vaginitis: Secondary | ICD-10-CM | POA: Insufficient documentation

## 2016-03-25 DIAGNOSIS — Z113 Encounter for screening for infections with a predominantly sexual mode of transmission: Secondary | ICD-10-CM

## 2016-03-25 DIAGNOSIS — K219 Gastro-esophageal reflux disease without esophagitis: Secondary | ICD-10-CM

## 2016-03-25 DIAGNOSIS — R300 Dysuria: Secondary | ICD-10-CM | POA: Diagnosis not present

## 2016-03-25 LAB — POCT URINALYSIS DIPSTICK
Bilirubin, UA: NEGATIVE
Glucose, UA: NEGATIVE
Ketones, UA: NEGATIVE
Leukocytes, UA: NEGATIVE
Nitrite, UA: NEGATIVE
Protein, UA: NEGATIVE
Spec Grav, UA: >=1.03
Urobilinogen, UA: NEGATIVE
pH, UA: 6

## 2016-03-25 NOTE — Addendum Note (Signed)
Addended by: Resa Miner R on: 03/25/2016 12:10 PM   Modules accepted: Orders

## 2016-03-25 NOTE — Patient Instructions (Addendum)
BEFORE YOU LEAVE: -follow up: yearly and as needed -labs -urine dip with reflex culture if needed -STI screening  Follow up if symptoms worsen or persist or new concerns arise.   We recommend the following healthy lifestyle for LIFE:  Eat a healthy clean diet.  * Tip: Avoid (less then 1 serving per week): processed foods, sweets, sweetened drinks, white starches (rice, flour, bread, potatoes, pasta, etc), red meat, fast foods, butter  *Tip: CHOOSE instead   * 5-9 servings per day of fresh or frozen fruits and vegetables (but not corn, potatoes, bananas, canned or dried fruit)   *nuts and seeds, beans   *olives and olive oil   *small portions of lean meats such as fish and white chicken    *small portions of whole grains  Get at least 150 minutes of sweaty aerobic exercise per week.  Reduce stress - consider counseling, meditation and relaxation to balance other aspects of your life.

## 2016-03-26 LAB — CERVICOVAGINAL ANCILLARY ONLY
CHLAMYDIA, DNA PROBE: NEGATIVE
Neisseria Gonorrhea: NEGATIVE
Trichomonas: NEGATIVE

## 2016-03-26 LAB — RPR

## 2016-03-26 LAB — HIV ANTIBODY (ROUTINE TESTING W REFLEX): HIV 1&2 Ab, 4th Generation: NONREACTIVE

## 2016-03-27 LAB — URINE CULTURE: Organism ID, Bacteria: NO GROWTH

## 2016-03-31 ENCOUNTER — Encounter: Payer: Self-pay | Admitting: Obstetrics and Gynecology

## 2016-03-31 ENCOUNTER — Telehealth: Payer: Self-pay | Admitting: Obstetrics and Gynecology

## 2016-03-31 LAB — CERVICOVAGINAL ANCILLARY ONLY: CANDIDA VAGINITIS: NEGATIVE

## 2016-03-31 NOTE — Telephone Encounter (Signed)
Left message to call Diamond Hansen at [phone].  

## 2016-03-31 NOTE — Telephone Encounter (Signed)
Please call and check on the patient. She has a h/o a small fibroid uterus, menorrhagia and mild anemia. She was started on OCP's 3 months ago, cancelled her f/u visit. Please see if she stopped OCP's, how her bleeding is and if she wants to consider other options.

## 2016-03-31 NOTE — Telephone Encounter (Signed)
Patient cancelled her 3 month recheck appointment less than 24 hours. Did not wish to reschedule at this time.

## 2016-04-01 ENCOUNTER — Other Ambulatory Visit: Payer: Self-pay | Admitting: Family Medicine

## 2016-04-01 ENCOUNTER — Ambulatory Visit: Payer: BLUE CROSS/BLUE SHIELD | Admitting: Obstetrics and Gynecology

## 2016-04-01 ENCOUNTER — Telehealth: Payer: Self-pay | Admitting: Family Medicine

## 2016-04-01 MED ORDER — METRONIDAZOLE 0.75 % VA GEL: 1.0000 | Freq: Two times a day (BID) | VAGINAL | 0 refills | Status: DC

## 2016-04-01 NOTE — Telephone Encounter (Signed)
See results note. 

## 2016-04-01 NOTE — Addendum Note (Signed)
Addended by: Agnes Lawrence on: 04/01/2016 06:08 PM   Modules accepted: Orders

## 2016-04-01 NOTE — Telephone Encounter (Signed)
Pt is returning joann call °

## 2016-04-02 NOTE — Telephone Encounter (Signed)
Left message to call Atarah Cadogan at [phone].  

## 2016-04-05 NOTE — Telephone Encounter (Signed)
Patient returning call.

## 2016-04-05 NOTE — Telephone Encounter (Signed)
Spoke with patient. Patient states OCP has been helping. Patient states she does not feel dizzy or as she was prior to [facility]. Patient reports spotting in between cycles. Patient states LMP 03/04/16; reports this cycle was heavy and last longer. Patient states she feels like she was changing pad q60min at start of cycle. Patient states she used all of the 3 month OCP that she had picked up; stopped 10/26 due to no refills.  Recommended OV for follow-up with Dr. Talbert Nan.  Patient states she has a balance due and would like to discuss that balance before scheduling OV. Transferred patient to [facility] for assistance. Starla advised patient hung up before answering call.    Dr. Talbert Nan, please advise?

## 2016-04-05 NOTE — Telephone Encounter (Signed)
Will ask Gay Filler to call the patient

## 2016-04-08 ENCOUNTER — Encounter: Payer: Self-pay | Admitting: Obstetrics and Gynecology

## 2016-04-08 ENCOUNTER — Ambulatory Visit (INDEPENDENT_AMBULATORY_CARE_PROVIDER_SITE_OTHER): Payer: BLUE CROSS/BLUE SHIELD | Admitting: Obstetrics and Gynecology

## 2016-04-08 VITALS — BP 122/80 | HR 78 | Resp 16 | Ht 67.0 in | Wt 182.0 lb

## 2016-04-08 DIAGNOSIS — Z862 Personal history of diseases of the blood and blood-forming organs and certain disorders involving the immune mechanism: Secondary | ICD-10-CM

## 2016-04-08 DIAGNOSIS — N920 Excessive and frequent menstruation with regular cycle: Secondary | ICD-10-CM

## 2016-04-08 DIAGNOSIS — D259 Leiomyoma of uterus, unspecified: Secondary | ICD-10-CM | POA: Diagnosis not present

## 2016-04-08 LAB — CBC
HCT: 39.2 % (ref 35.0–45.0)
Hemoglobin: 12.8 g/dL (ref 11.7–15.5)
MCH: 26.8 pg — ABNORMAL LOW (ref 27.0–33.0)
MCHC: 32.7 g/dL (ref 32.0–36.0)
MCV: 82.2 fL (ref 80.0–100.0)
MPV: 9.6 fL (ref 7.5–12.5)
PLATELETS: 447 10*3/uL — AB (ref 140–400)
RBC: 4.77 MIL/uL (ref 3.80–5.10)
RDW: 14.2 % (ref 11.0–15.0)
WBC: 7.4 10*3/uL (ref 3.8–10.8)

## 2016-04-08 LAB — FERRITIN: FERRITIN: 28 ng/mL (ref 10–154)

## 2016-04-08 NOTE — Progress Notes (Signed)
Patient ID: Diamond Hansen, female   DOB: 1976/03/23, 40 y.o.   MRN: [id number]  GYNECOLOGY  VISIT   HPI: 40 y.o.   Single  African American  female   [phone] with Patient's last menstrual period was 03/18/2016.   here for follow up of oral contraceptives. The patient has a h/o menorrhagia leading to mild anemia. U/S with several small myomas, all intramural and subserosal. She was started on OCP's at the end of July. She has run out. On the pill, she felt better, didn't feel drained. Still had heavy cycles. She was bleeding monthly on the pill for 8 days, spotting the last several days. She was saturating a pad very quickly, in up to 10 minutes at times. In September she did have some light bleeding in the middle of her cycle.  She has been on depo-provera in the past, didn't like.   GYNECOLOGIC HISTORY: Patient's last menstrual period was 03/18/2016. Contraception: Tubal Ligation Menopausal hormone therapy: none        OB History    Gravida Para Term Preterm AB Living   3 3 3  0 0 3   SAB TAB Ectopic Multiple Live Births   0 0 0 0 3         Patient Active Problem List   Diagnosis Date Noted  . Bacterial vaginitis, recurrent 01/08/2014  . GERD (gastroesophageal reflux disease) 01/08/2014    Past Medical History:  Diagnosis Date  . Anemia   . GERD (gastroesophageal reflux disease)     Past Surgical History:  Procedure Laterality Date  . TUBAL LIGATION    . UMBILICAL HERNIA REPAIR     age 12    Current Outpatient Prescriptions  Medication Sig Dispense Refill  . Ferrous Sulfate (IRON) 325 (65 Fe) MG TABS Take by mouth.    . metroNIDAZOLE (METROGEL VAGINAL) 0.75 % vaginal gel Place 1 Applicatorful vaginally 2 (two) times daily. 70 g 0  . naproxen sodium (ANAPROX DS) 550 MG tablet 1 tab po q 12 hours prn 30 tablet 2  . norethindrone-ethinyl estradiol (JUNEL FE,GILDESS FE,LOESTRIN FE) 1-20 MG-MCG tablet Take 1 tablet by mouth daily. (Patient not taking: Reported on 04/08/2016) 3  Package 0   No current facility-administered medications for this visit.      ALLERGIES: Review of patient's allergies indicates no known allergies.  Family History  Problem Relation Age of Onset  . Hypertension Father   . Diabetes Brother     Social History   Social History  . Marital status: Single    Spouse name: N/A  . Number of children: N/A  . Years of education: N/A   Occupational History  . Not on file.   Social History Main Topics  . Smoking status: Former Research scientist (life sciences)  . Smokeless tobacco: Never Used     Comment: quit in 2011, smoke on and off for 8 years  . Alcohol use 0.0 oz/week     Comment: occ  . Drug use: No  . Sexual activity: No   Other Topics Concern  . Not on file   Social History Narrative   Work or School: works at [employer] Situation: lives with 3 daughter (88, 63, 78)      Spiritual Beliefs: none      Lifestyle: walks a few days per week; diet is so so             Review of Systems  Constitutional: Negative.   HENT: Negative.  Eyes: Negative.   Respiratory: Negative.   Cardiovascular: Negative.   Gastrointestinal: Negative.   Genitourinary:       Unscheduled bleeding   Musculoskeletal: Negative.   Skin: Negative.   Neurological: Negative.   Endo/Heme/Allergies: Negative.   Psychiatric/Behavioral: Negative.     PHYSICAL EXAMINATION:    BP 122/80 (BP Location: Right Arm, Patient Position: Sitting, Cuff Size: Normal)   Pulse 78   Resp 16   Ht 5\' 7"  (1.702 m)   Wt 182 lb (82.6 kg)   LMP 03/18/2016   BMI 28.51 kg/m     General appearance: alert, cooperative and appears stated age  Pelvic: External genitalia:  no lesions              Urethra:  normal appearing urethra with no masses, tenderness or lesions              Bartholins and Skenes: normal                 Vagina: normal appearing vagina with normal color and discharge, no lesions              Cervix: no lesions              Bimanual Exam:  Uterus:   retroverted, slighlty enlarged, not tender              Adnexa: no mass, fullness, tenderness   The risks of endometrial biopsy were reviewed and a consent was obtained.  A speculum was placed in the vagina and the cervix was cleansed with betadine. The mini-pipelle was placed into the endometrial cavity. The uterus sounded to 8 cm. The endometrial biopsy was performed, moderate tissue was obtained. The speculum was removed. There were no complications.                 Chaperone was present for exam.  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  Information on the mirena IUD was given   An After Visit Summary was printed and given to the patient.  20 minutes face to face time of which over 50% was spent in counseling.

## 2016-04-14 ENCOUNTER — Telehealth: Payer: Self-pay | Admitting: *Deleted

## 2016-04-14 MED ORDER — NORETHIN ACE-ETH ESTRAD-FE 1-20 MG-MCG PO TABS: 1.0000 | ORAL_TABLET | Freq: Every day | ORAL | 0 refills | Status: DC

## 2016-04-14 NOTE — Telephone Encounter (Signed)
RX for OCP sent in and informed patient of BX results

## 2016-04-14 NOTE — Telephone Encounter (Signed)
-----   Message from Salvadore Dom, MD sent at 04/12/2016  4:54 PM EST ----- Please inform the patient that her biopsy results are benign

## 2016-04-16 NOTE — Telephone Encounter (Signed)
Office visit 04/08/16.

## 2016-05-27 ENCOUNTER — Ambulatory Visit: Payer: Self-pay | Admitting: Obstetrics and Gynecology

## 2016-07-01 ENCOUNTER — Telehealth: Payer: Self-pay | Admitting: Family Medicine

## 2016-07-01 NOTE — Telephone Encounter (Signed)
Ok to refill metro gel per instructions from last treatment. If any further symptoms advise gyn follow up.

## 2016-07-01 NOTE — Telephone Encounter (Signed)
Pt has another BV infection and would like medication send to [facility].

## 2016-07-02 MED ORDER — METRONIDAZOLE 0.75 % VA GEL: 1.0000 | Freq: Two times a day (BID) | VAGINAL | 0 refills | Status: DC

## 2016-07-02 NOTE — Telephone Encounter (Signed)
Rx done and I called the pt and informed her of the message below.  She stated she did have an appt with a GYN in December and will call back to reschedule.

## 2016-07-05 ENCOUNTER — Other Ambulatory Visit: Payer: Self-pay | Admitting: Obstetrics and Gynecology

## 2016-07-06 NOTE — Telephone Encounter (Signed)
Medication refill request: Norethindrone-Ethinyl Estradiol Last AEX:  10/30/15 JJ (for menorrhagia) Next AEX: 07/28/16 JJ Last MMG (if hormonal medication request): n/a Refill authorized: 04/14/16 #3 Packages 0R. Please advise. Thank you.

## 2016-07-28 ENCOUNTER — Ambulatory Visit (INDEPENDENT_AMBULATORY_CARE_PROVIDER_SITE_OTHER): Payer: BLUE CROSS/BLUE SHIELD | Admitting: Obstetrics and Gynecology

## 2016-07-28 ENCOUNTER — Encounter: Payer: Self-pay | Admitting: Obstetrics and Gynecology

## 2016-07-28 VITALS — BP 120/78 | HR 84 | Resp 16 | Ht 66.5 in | Wt 177.0 lb

## 2016-07-28 DIAGNOSIS — Z124 Encounter for screening for malignant neoplasm of cervix: Secondary | ICD-10-CM | POA: Diagnosis not present

## 2016-07-28 DIAGNOSIS — D259 Leiomyoma of uterus, unspecified: Secondary | ICD-10-CM | POA: Diagnosis not present

## 2016-07-28 DIAGNOSIS — R350 Frequency of micturition: Secondary | ICD-10-CM

## 2016-07-28 DIAGNOSIS — Z01419 Encounter for gynecological examination (general) (routine) without abnormal findings: Secondary | ICD-10-CM

## 2016-07-28 DIAGNOSIS — N898 Other specified noninflammatory disorders of vagina: Secondary | ICD-10-CM

## 2016-07-28 DIAGNOSIS — N920 Excessive and frequent menstruation with regular cycle: Secondary | ICD-10-CM

## 2016-07-28 DIAGNOSIS — R351 Nocturia: Secondary | ICD-10-CM

## 2016-07-28 MED ORDER — NORETHIN ACE-ETH ESTRAD-FE 1-20 MG-MCG PO TABS: 1.0000 | ORAL_TABLET | Freq: Every day | ORAL | 3 refills | Status: DC

## 2016-07-28 MED ORDER — NAPROXEN SODIUM 550 MG PO TABS: ORAL_TABLET | ORAL | 2 refills | Status: DC

## 2016-07-28 NOTE — Addendum Note (Signed)
Addended by: Dorothy Spark on: 07/28/2016 03:28 PM   Modules accepted: Orders

## 2016-07-28 NOTE — Progress Notes (Signed)
41 y.o. Diamond Hansen:[id number] SingleAfrican AmericanF here for annual exam.   She has a fibroid uterus, started on OCP's last year for menorrhagia leading to mild anemia. Bleeding is better. Just occasional spotting in between her cycle. She occasionally will have a heavy flow where she can saturate a pad in 30 minutes.  Period Cycle (Days): 28 Period Duration (Days): 5 days  Period Pattern: Regular Menstrual Flow: Moderate Menstrual Control: Maxi pad Menstrual Control Change Freq (Hours): changes pad every 2-3 hours Dysmenorrhea: None  Not currently sexually active. She notices some odor not sure if it's urine of BV.  Patient's last menstrual period was 07/18/2016.          Sexually active: No.  The current method of family planning is Tubal Ligation/OCP/ (estrogen/progesterone).    Exercising: No.  The patient does not participate in regular exercise at present. Smoker:  Former smoker  Health Maintenance: Pap:  06-05-15 WNL NEG HR HPV History of abnormal Pap:  Yes, in 10/15: negative with +HPV MMG:  Never Colonoscopy:  Never BMD:   Never TDaP:  05-16-14 Gardasil: N/A   reports that she has quit smoking. She has never used smokeless tobacco. She reports that she does not drink alcohol or use drugs. She is a Teacher, English as a foreign language at [employer], has 3 daughters.   Past Medical History:  Diagnosis Date  . Anemia   . GERD (gastroesophageal reflux disease)     Past Surgical History:  Procedure Laterality Date  . TUBAL LIGATION    . UMBILICAL HERNIA REPAIR     age 66    Current Outpatient Prescriptions  Medication Sig Dispense Refill  . Ferrous Sulfate (IRON) 325 (65 Fe) MG TABS Take by mouth.    Marland Kitchen MICROGESTIN FE 1/20 1-20 MG-MCG tablet TAKE ONE TABLET BY MOUTH ONCE DAILY 84 tablet 0  . naproxen sodium (ANAPROX DS) 550 MG tablet 1 tab po q 12 hours prn 30 tablet 2   No current facility-administered medications for this visit.     Family History  Problem Relation Age of Onset  .  Hypertension Father   . Diabetes Brother     Review of Systems  Constitutional: Negative.   HENT: Positive for ear pain.   Eyes: Negative.   Respiratory: Negative.   Cardiovascular: Negative.   Gastrointestinal: Negative.   Endocrine: Negative.   Genitourinary: Negative.   Musculoskeletal: Negative.        Left foot pain   Skin: Negative.   Allergic/Immunologic: Negative.   Neurological: Negative.   Psychiatric/Behavioral: Negative.   She gets up to void 2-3 x at night, wonders if she leaks a little during the night. Not leaking during the day. She drinks 2-3 bottles of water prior to bed. She drinks some tea. Small cup of coffee q am.   Exam:   BP 120/78 (BP Location: Right Arm, Patient Position: Sitting, Cuff Size: Normal)   Pulse 84   Resp 16   Ht 5' 6.5" (1.689 m)   Wt 177 lb (80.3 kg)   LMP 07/18/2016   BMI 28.14 kg/m   Weight change: @WEIGHTCHANGE @ Height:   Height: 5' 6.5" (168.9 cm)  Ht Readings from Last 3 Encounters:  07/28/16 5' 6.5" (1.689 m)  04/08/16 5\' 7"  (1.702 m)  03/25/16 5\' 7"  (1.702 m)    General appearance: alert, cooperative and appears stated age Head: Normocephalic, without obvious abnormality, atraumatic Neck: no adenopathy, supple, symmetrical, trachea midline and thyroid normal to inspection and palpation Lungs: clear to auscultation bilaterally  Cardiovascular: regular rate and rhythm Breasts: normal appearance, no masses or tenderness Heart: regular rate and rhythm Abdomen: soft, non-tender; bowel sounds normal; no masses,  no organomegaly Extremities: extremities normal, atraumatic, no cyanosis or edema Skin: Skin color, texture, turgor normal. No rashes or lesions Lymph nodes: Cervical, supraclavicular, and axillary nodes normal. No abnormal inguinal nodes palpated Neurologic: Grossly normal   Pelvic: External genitalia:  no lesions              Urethra:  normal appearing urethra with no masses, tenderness or lesions               Bartholins and Skenes: normal                 Vagina: normal appearing vagina with normal color and discharge, no lesions              Cervix: no lesions               Bimanual Exam:  Uterus:  normal size, contour, position, consistency, mobility, non-tender and retroverted              Adnexa: no mass, fullness, tenderness               Rectovaginal: Confirms               Anus:  normal sphincter tone, no lesions  Chaperone was present for exam.  A:  Well Woman with normal exam  Nocturia, some frequency and urgency, suspect it's from her drinking lots of water.   H/O BV, she wonders if she still has it  Menorrhagia, helped with OCP's, still has episodes of very heavy bleeding  Fibroid uterus, several small fibroids, uterus not enlarged on exam  P:   Send urine for ua, c&s  Decrease water intake prior to bed  Use some AZO to determine if she is leaking  Pap with hpv  CBC, Ferritin  Continue OCP's  Anaprox for cramps  Other labs with primary MD  Discussed continuing OCP's, Mirena IUD, uterine artery embolization and hysterectomy as treatment options. Will continue OCP's for now.

## 2016-07-28 NOTE — Patient Instructions (Signed)

## 2016-07-29 LAB — CBC
HCT: 40.9 % (ref 35.0–45.0)
Hemoglobin: 13.2 g/dL (ref 11.7–15.5)
MCH: 26.6 pg — ABNORMAL LOW (ref 27.0–33.0)
MCHC: 32.3 g/dL (ref 32.0–36.0)
MCV: 82.3 fL (ref 80.0–100.0)
MPV: 9 fL (ref 7.5–12.5)
Platelets: 436 10*3/uL — ABNORMAL HIGH (ref 140–400)
RBC: 4.97 MIL/uL (ref 3.80–5.10)
RDW: 14.3 % (ref 11.0–15.0)
WBC: 7.4 10*3/uL (ref 3.8–10.8)

## 2016-07-29 LAB — URINALYSIS, MICROSCOPIC ONLY
Crystals: NONE SEEN [HPF]
RBC / HPF: NONE SEEN RBC/HPF (ref <=2)
Yeast: NONE SEEN [HPF]

## 2016-07-29 LAB — FERRITIN: Ferritin: 38 ng/mL (ref 10–154)

## 2016-07-30 LAB — WET PREP BY MOLECULAR PROBE
CANDIDA SPECIES: NEGATIVE
GARDNERELLA VAGINALIS: NEGATIVE
TRICHOMONAS VAG: NEGATIVE

## 2016-07-30 LAB — URINE CULTURE: Organism ID, Bacteria: NO GROWTH

## 2016-08-02 LAB — IPS PAP TEST WITH HPV

## 2016-08-12 ENCOUNTER — Ambulatory Visit (INDEPENDENT_AMBULATORY_CARE_PROVIDER_SITE_OTHER): Payer: BLUE CROSS/BLUE SHIELD | Admitting: Family Medicine

## 2016-08-12 ENCOUNTER — Encounter: Payer: Self-pay | Admitting: Family Medicine

## 2016-08-12 VITALS — BP 118/82 | HR 89 | Temp 98.7°F | Ht 66.5 in | Wt 178.7 lb

## 2016-08-12 DIAGNOSIS — R2242 Localized swelling, mass and lump, left lower limb: Secondary | ICD-10-CM | POA: Diagnosis not present

## 2016-08-12 NOTE — Progress Notes (Signed)
HPI:  Acute visit for foot mass: -noticed in last few weeks, but unsure how long it has been there -uncomfortable to walk on -no lumps elsewhere, known injury, redness, drainage -desires removal  ROS: See pertinent positives and negatives per HPI.  Past Medical History:  Diagnosis Date  . Anemia   . GERD (gastroesophageal reflux disease)     Past Surgical History:  Procedure Laterality Date  . TUBAL LIGATION    . UMBILICAL HERNIA REPAIR     age 41    Family History  Problem Relation Age of Onset  . Hypertension Father   . Diabetes Brother     Social History   Social History  . Marital status: Single    Spouse name: N/A  . Number of children: N/A  . Years of education: N/A   Social History Main Topics  . Smoking status: Former Research scientist (life sciences)  . Smokeless tobacco: Never Used     Comment: quit in 2011, smoke on and off for 8 years  . Alcohol use No  . Drug use: No  . Sexual activity: No   Other Topics Concern  . None   Social History Narrative   Work or School: works at [employer] Situation: lives with 3 daughter (1, 9, 60)      Spiritual Beliefs: none      Lifestyle: walks a few days per week; diet is so so              Current Outpatient Prescriptions:  .  Ferrous Sulfate (IRON) 325 (65 Fe) MG TABS, Take by mouth., Disp: , Rfl:  .  naproxen sodium (ANAPROX DS) 550 MG tablet, 1 tab po q 12 hours prn, Disp: 30 tablet, Rfl: 2 .  norethindrone-ethinyl estradiol (MICROGESTIN FE 1/20) 1-20 MG-MCG tablet, Take 1 tablet by mouth daily., Disp: 84 tablet, Rfl: 3 .  Omeprazole (PRILOSEC PO), Take by mouth daily., Disp: , Rfl:   EXAM:  Vitals:   08/12/16 0753  BP: 118/82  Pulse: 89  Temp: 98.7 F (37.1 C)    Body mass index is 28.41 kg/m.  GENERAL: vitals reviewed and listed above, alert, oriented, appears well hydrated and in no acute distress  HEENT: atraumatic, conjunttiva clear, no obvious abnormalities on inspection of external nose and  ears  NECK: no obvious masses on inspection  MS: moves all extremities without noticeable abnormality; rubgerry mass mid plantar foot approx 1-1.5 cm in diameter  PSYCH: pleasant and cooperative, no obvious depression or anxiety  ASSESSMENT AND PLAN:  Discussed the following assessment and plan:  Mass of left foot - Plan: Ambulatory referral to Orthopedic Surgery  -we discussed possible serious and likely etiologies, workup and treatment, treatment risks and return precautions -after this discussion, Donisha opted for referral to specialist for further evaluation and surgical removal -of course, we advised Charisse  to return or notify a doctor immediately if symptoms worsen or persist or new concerns arise.   Patient Instructions  -We placed a referral for you as discussed to the foot surgeon for removal of the mass. It usually takes about 1 wees to process and schedule this referral. If you have not heard from [place] regarding this appointment in 1-2 weeks please contact our office.  WE NOW OFFER   [facility] FAST TRACK!!!  SAME DAY Appointments for ACUTE CARE  Such as: Sprains, Injuries, cuts, abrasions, rashes, muscle pain, joint pain, back pain Colds, flu, sore throats, headache, allergies, cough, fever  Ear pain, sinus and eye infections Abdominal pain, nausea, vomiting, diarrhea, upset stomach Animal/insect bites  3 Easy Ways to Schedule: Walk-In Scheduling Call in scheduling Mychart Sign-up: [email or web address]             Colin Benton R., DO

## 2016-08-12 NOTE — Progress Notes (Signed)
Pre visit review using our clinic review tool, if applicable. No additional management support is needed unless otherwise documented below in the visit note. 

## 2016-08-12 NOTE — Patient Instructions (Signed)
-  We placed a referral for you as discussed to the foot surgeon for removal of the mass. It usually takes about 1 wees to process and schedule this referral. If you have not heard from [place] regarding this appointment in 1-2 weeks please contact our office.  WE NOW OFFER   [facility] FAST TRACK!!!  SAME DAY Appointments for ACUTE CARE  Such as: Sprains, Injuries, cuts, abrasions, rashes, muscle pain, joint pain, back pain Colds, flu, sore throats, headache, allergies, cough, fever  Ear pain, sinus and eye infections Abdominal pain, nausea, vomiting, diarrhea, upset stomach Animal/insect bites  3 Easy Ways to Schedule: Walk-In Scheduling Call in scheduling Mychart Sign-up: [email or web address]

## 2016-08-18 ENCOUNTER — Ambulatory Visit (INDEPENDENT_AMBULATORY_CARE_PROVIDER_SITE_OTHER): Payer: BLUE CROSS/BLUE SHIELD | Admitting: Orthopedic Surgery

## 2016-08-25 ENCOUNTER — Encounter (INDEPENDENT_AMBULATORY_CARE_PROVIDER_SITE_OTHER): Payer: Self-pay | Admitting: Orthopedic Surgery

## 2016-08-25 ENCOUNTER — Ambulatory Visit (INDEPENDENT_AMBULATORY_CARE_PROVIDER_SITE_OTHER): Payer: BLUE CROSS/BLUE SHIELD | Admitting: Orthopedic Surgery

## 2016-08-25 DIAGNOSIS — M722 Plantar fascial fibromatosis: Secondary | ICD-10-CM | POA: Insufficient documentation

## 2016-08-25 NOTE — Progress Notes (Signed)
   Office Visit Note   Patient: Diamond Hansen           Date of Birth: 1975/09/10           MRN: [id number] Visit Date: 08/25/2016              Requested by: [name], DO [address] [place], [place] [address] PCP: [name]., DO  Chief Complaint  Patient presents with  . Left Foot - Pain    Left foot mass    DXA:JOINOMV is a 41 year old woman states she works about 8 hours a day on her feet she states that in the beginning of February she noticed a mass on the plantar aspect of the left foot she has pain with weightbearing pain with walking on hardwood floor with bare feet. Pain with ambulation. HPI  Assessment & Plan: Visit Diagnoses:  1. Plantar fascial fibromatosis     Plan: Discussed with the patient she has plantar fibromatosis. Recommended orthotics to unload pressure discussed that if this becomes significantly painful enough that we could proceed with excision of the plantar fibromatosis. Discussed that there is a risk of reoccurrence.  Follow-Up Instructions: Return if symptoms worsen or fail to improve.   Ortho Exam On examination patient is alert oriented no adenopathy well-dressed normal affect normal respiratory effort she has a normal gait. She is a good dorsalis pedis and posterior tibial pulses she has good ankle and subtalar range of motion there is no skin color or temperature changes. She has a plantar fibromatosis nodule which is approximately 2 cm in diameter on the plantar aspect of the left foot. There is no ulcerations no signs of infection. Examination of her hand she has no Dupuytren's contracture in either hand. ROS: Negative for fever or chills complete review of systems otherwise negative. Imaging: No results found.  Labs: Lab Results  Component Value Date   HGBA1C 6.0 06/05/2015   HGBA1C 6.3 08/22/2014   HGBA1C 6.4 05/16/2014   REPTSTATUS 12/22/2012 FINAL 12/21/2012   CULT NO GROWTH 12/21/2012   LABORGA NO GROWTH 07/28/2016     Orders:  No orders of the defined types were placed in this encounter.  No orders of the defined types were placed in this encounter.    Procedures: No procedures performed  Clinical Data: No additional findings.  Subjective: Review of Systems  Objective: Vital Signs: There were no vitals taken for this visit.  Specialty Comments:  No specialty comments available.  PMFS History: Patient Active Problem List   Diagnosis Date Noted  . Plantar fascial fibromatosis 08/25/2016  . Bacterial vaginitis, recurrent 01/08/2014  . GERD (gastroesophageal reflux disease) 01/08/2014   Past Medical History:  Diagnosis Date  . Anemia   . GERD (gastroesophageal reflux disease)     Family History  Problem Relation Age of Onset  . Hypertension Father   . Diabetes Brother     Past Surgical History:  Procedure Laterality Date  . TUBAL LIGATION    . UMBILICAL HERNIA REPAIR     age 41   Social History   Occupational History  . Not on file.   Social History Main Topics  . Smoking status: Former Research scientist (life sciences)  . Smokeless tobacco: Never Used     Comment: quit in 2011, smoke on and off for 8 years  . Alcohol use No  . Drug use: No  . Sexual activity: No

## 2017-01-12 ENCOUNTER — Encounter: Payer: Self-pay | Admitting: Obstetrics and Gynecology

## 2017-02-04 ENCOUNTER — Encounter: Payer: Self-pay | Admitting: Obstetrics and Gynecology

## 2017-09-08 ENCOUNTER — Other Ambulatory Visit: Payer: Self-pay | Admitting: Obstetrics and Gynecology

## 2018-01-05 NOTE — Progress Notes (Signed)
Chief Complaint  Patient presents with  . Vaginal Discharge    Pt has h/o BV, thick discharge and vaginal itching x 1 week.     HPI: Diamond Hansen 42 y.o.   sda PCP NA     Last year.  Had  Same thinks its bv  Thick slightly itchy dc noted for a week  Periods normal   On ocps  5 days.  No antibiotics    One partners.    monogomous  No self treatment.   lmp ended ended  July 27  ROS: See pertinent positives and negatives per HPI.  Past Medical History:  Diagnosis Date  . Anemia   . GERD (gastroesophageal reflux disease)     Family History  Problem Relation Age of Onset  . Hypertension Father   . Diabetes Brother     Social History   Socioeconomic History  . Marital status: Single    Spouse name: Not on file  . Number of children: Not on file  . Years of education: Not on file  . Highest education level: Not on file  Occupational History  . Not on file  Social Needs  . Financial resource strain: Not on file  . Food insecurity:    Worry: Not on file    Inability: Not on file  . Transportation needs:    Medical: Not on file    Non-medical: Not on file  Tobacco Use  . Smoking status: Former Research scientist (life sciences)  . Smokeless tobacco: Never Used  . Tobacco comment: quit in 2011, smoke on and off for 8 years  Substance and Sexual Activity  . Alcohol use: No    Alcohol/week: 0.0 oz  . Drug use: No  . Sexual activity: Never    Partners: Male  Lifestyle  . Physical activity:    Days per week: Not on file    Minutes per session: Not on file  . Stress: Not on file  Relationships  . Social connections:    Talks on phone: Not on file    Gets together: Not on file    Attends religious service: Not on file    Active member of club or organization: Not on file    Attends meetings of clubs or organizations: Not on file    Relationship status: Not on file  Other Topics Concern  . Not on file  Social History Narrative   Work or School: works at [employer] Situation:  lives with 3 daughter (40, 105, 54)      Spiritual Beliefs: none      Lifestyle: walks a few days per week; diet is so so             Outpatient Medications Prior to Visit  Medication Sig Dispense Refill  . Ferrous Sulfate (IRON) 325 (65 Fe) MG TABS Take by mouth.    . naproxen sodium (ANAPROX DS) 550 MG tablet 1 tab po q 12 hours prn 30 tablet 2  . norethindrone-ethinyl estradiol (MICROGESTIN FE 1/20) 1-20 MG-MCG tablet Take 1 tablet by mouth daily. 84 tablet 3  . Omeprazole (PRILOSEC PO) Take by mouth daily.     No facility-administered medications prior to visit.      EXAM:  BP 122/80 (BP Location: Right Arm, Patient Position: Sitting, Cuff Size: Normal)   Pulse 72   Temp 98.2 F (36.8 C) (Oral)   Wt 182 lb 1.6 oz (82.6 kg)   BMI 28.95 kg/m  Body mass index is 28.95 kg/m.  GENERAL: vitals reviewed and listed above, alert, oriented, appears well hydrated and in no acute distress HEENT: atraumatic, conjunctiva  clear, no obvious abnormalities on inspection of external nose and ears  Abd soft.    GU no lesion speculum exam cx flat ectopy slight friability  Gray musousy dc noted   aptima done for gc chl trick bv and yeast PSYCH: pleasant and cooperative, no obvious depression or anxiety  ASSESSMENT AND PLAN:  Discussed the following assessment and plan:  Acute vaginitis - Plan: Cervicovaginal ancillary only  Vaginal discharge - hx recurrent BV Empiric rx   And await  aptima results .  -Patient advised to return or notify health care team  if symptoms worsen ,persist or new concerns arise.  Patient Instructions  Checking for  All infections   Can treat in interim for bacterial  BV  As in past .  Will be notified  whe results are back.      Vaginitis Vaginitis is a condition in which the vaginal tissue swells and becomes red (inflamed). This condition is most often caused by a change in the normal balance of bacteria and yeast that live in the vagina. This  change causes an overgrowth of certain bacteria or yeast, which causes the inflammation. There are different types of vaginitis, but the most common types are:  Bacterial vaginosis.  Yeast infection (candidiasis).  Trichomoniasis vaginitis. This is a sexually transmitted disease (STD).  Viral vaginitis.  Atrophic vaginitis.  Allergic vaginitis.  What are the causes? The cause of this condition depends on the type of vaginitis. It can be caused by:  Bacteria (bacterial vaginosis).  Yeast, which is a fungus (yeast infection).  A parasite (trichomoniasis vaginitis).  A virus (viral vaginitis).  Low hormone levels (atrophic vaginitis). Low hormone levels can occur during pregnancy, breastfeeding, or after menopause.  Irritants, such as bubble baths, scented tampons, and feminine sprays (allergic vaginitis).  Other factors can change the normal balance of the yeast and bacteria that live in the vagina. These include:  Antibiotic medicines.  Poor hygiene.  Diaphragms, vaginal sponges, spermicides, birth control pills, and intrauterine devices (IUD).  Sex.  Infection.  Uncontrolled diabetes.  A weakened defense (immune) system.  What increases the risk? This condition is more likely to develop in women who:  Smoke.  Use vaginal douches, scented tampons, or scented sanitary pads.  Wear tight-fitting pants.  Wear thong underwear.  Use oral birth control pills or an IUD.  Have sex without a condom.  Have multiple sex partners.  Have an STD.  Frequently use the spermicide nonoxynol-9.  Eat lots of foods high in sugar.  Have uncontrolled diabetes.  Have low estrogen levels.  Have a weakened immune system from an immune disorder or medical treatment.  Are pregnant or breastfeeding.  What are the signs or symptoms? Symptoms vary depending on the cause of the vaginitis. Common symptoms include:  Abnormal vaginal discharge. ? The discharge is white,  gray, or yellow with bacterial vaginosis. ? The discharge is thick, white, and cheesy with a yeast infection. ? The discharge is frothy and yellow or greenish with trichomoniasis.  A bad vaginal smell. The smell is fishy with bacterial vaginosis.  Vaginal itching, pain, or swelling.  Sex that is painful.  Pain or burning when urinating.  Sometimes there are no symptoms. How is this diagnosed? This condition is diagnosed based on your symptoms and medical history. A physical exam, including a pelvic  exam, will also be done. You may also have other tests, including:  Tests to determine the pH level (acidity or alkalinity) of your vagina.  A whiff test, to assess the odor that results when a sample of your vaginal discharge is mixed with a potassium hydroxide solution.  Tests of vaginal fluid. A sample will be examined under a microscope.  How is this treated? Treatment varies depending on the type of vaginitis you have. Your treatment may include:  Antibiotic creams or pills to treat bacterial vaginosis and trichomoniasis.  Antifungal medicines, such as vaginal creams or suppositories, to treat a yeast infection.  Medicine to ease discomfort if you have viral vaginitis. Your sexual partner should also be treated.  Estrogen delivered in a cream, pill, suppository, or vaginal ring to treat atrophic vaginitis. If vaginal dryness occurs, lubricants and moisturizing creams may help. You may need to avoid scented soaps, sprays, or douches.  Stopping use of a product that is causing allergic vaginitis. Then using a vaginal cream to treat the symptoms.  Follow these instructions at home: Lifestyle  Keep your genital area clean and dry. Avoid soap, and only rinse the area with water.  Do not douche or use tampons until your health care provider says it is okay to do so. Use sanitary pads, if needed.  Do not have sex until your health care provider approves. When you can return to sex,  practice safe sex and use condoms.  Wipe from front to back. This avoids the spread of bacteria from the rectum to the vagina. General instructions  Take over-the-counter and prescription medicines only as told by your health care provider.  If you were prescribed an antibiotic medicine, take or use it as told by your health care provider. Do not stop taking or using the antibiotic even if you start to feel better.  Keep all follow-up visits as told by your health care provider. This is important. How is this prevented?  Use mild, non-scented products. Do not use things that can irritate the vagina, such as fabric softeners. Avoid the following products if they are scented: ? Feminine sprays. ? Detergents. ? Tampons. ? Feminine hygiene products. ? Soaps or bubble baths.  Let air reach your genital area. ? Wear cotton underwear to reduce moisture buildup. ? Avoid wearing underwear while you sleep. ? Avoid wearing tight pants and underwear or nylons without a cotton panel. ? Avoid wearing thong underwear.  Take off any wet clothing, such as bathing suits, as soon as possible.  Practice safe sex and use condoms. Contact a health care provider if:  You have abdominal pain.  You have a fever.  You have symptoms that last for more than 2-3 days. Get help right away if:  You have a fever and your symptoms suddenly get worse. Summary  Vaginitis is a condition in which the vaginal tissue becomes inflamed.This condition is most often caused by a change in the normal balance of bacteria and yeast that live in the vagina.  Treatment varies depending on the type of vaginitis you have.  Do not douche, use tampons , or have sex until your health care provider approves. When you can return to sex, practice safe sex and use condoms. This information is not intended to replace advice given to you by your health care provider. Make sure you discuss any questions you have with your health  care provider. Document Released: 03/21/2007 Document Revised: 06/29/2016 Document Reviewed: 06/29/2016 Elsevier Interactive Patient Education  2018 Firthcliffe Panosh M.D.

## 2018-01-06 ENCOUNTER — Ambulatory Visit (INDEPENDENT_AMBULATORY_CARE_PROVIDER_SITE_OTHER): Payer: BLUE CROSS/BLUE SHIELD | Admitting: Internal Medicine

## 2018-01-06 ENCOUNTER — Other Ambulatory Visit (HOSPITAL_COMMUNITY)
Admission: RE | Admit: 2018-01-06 | Discharge: 2018-01-06 | Disposition: A | Discharge status: Home / Self Care | Payer: BLUE CROSS/BLUE SHIELD | Source: Ambulatory Visit | Attending: Internal Medicine | Admitting: Internal Medicine

## 2018-01-06 ENCOUNTER — Encounter: Payer: Self-pay | Admitting: Internal Medicine

## 2018-01-06 VITALS — BP 122/80 | HR 72 | Temp 98.2°F | Wt 182.1 lb

## 2018-01-06 DIAGNOSIS — N898 Other specified noninflammatory disorders of vagina: Secondary | ICD-10-CM | POA: Diagnosis not present

## 2018-01-06 DIAGNOSIS — N760 Acute vaginitis: Secondary | ICD-10-CM | POA: Diagnosis not present

## 2018-01-06 MED ORDER — METRONIDAZOLE 0.75 % VA GEL: 1.0000 | Freq: Two times a day (BID) | VAGINAL | 0 refills | Status: DC

## 2018-01-06 NOTE — Patient Instructions (Signed)
Checking for  All infections   Can treat in interim for bacterial  BV  As in past .  Will be notified  whe results are back.      Vaginitis Vaginitis is a condition in which the vaginal tissue swells and becomes red (inflamed). This condition is most often caused by a change in the normal balance of bacteria and yeast that live in the vagina. This change causes an overgrowth of certain bacteria or yeast, which causes the inflammation. There are different types of vaginitis, but the most common types are:  Bacterial vaginosis.  Yeast infection (candidiasis).  Trichomoniasis vaginitis. This is a sexually transmitted disease (STD).  Viral vaginitis.  Atrophic vaginitis.  Allergic vaginitis.  What are the causes? The cause of this condition depends on the type of vaginitis. It can be caused by:  Bacteria (bacterial vaginosis).  Yeast, which is a fungus (yeast infection).  A parasite (trichomoniasis vaginitis).  A virus (viral vaginitis).  Low hormone levels (atrophic vaginitis). Low hormone levels can occur during pregnancy, breastfeeding, or after menopause.  Irritants, such as bubble baths, scented tampons, and feminine sprays (allergic vaginitis).  Other factors can change the normal balance of the yeast and bacteria that live in the vagina. These include:  Antibiotic medicines.  Poor hygiene.  Diaphragms, vaginal sponges, spermicides, birth control pills, and intrauterine devices (IUD).  Sex.  Infection.  Uncontrolled diabetes.  A weakened defense (immune) system.  What increases the risk? This condition is more likely to develop in women who:  Smoke.  Use vaginal douches, scented tampons, or scented sanitary pads.  Wear tight-fitting pants.  Wear thong underwear.  Use oral birth control pills or an IUD.  Have sex without a condom.  Have multiple sex partners.  Have an STD.  Frequently use the spermicide nonoxynol-9.  Eat lots of foods high  in sugar.  Have uncontrolled diabetes.  Have low estrogen levels.  Have a weakened immune system from an immune disorder or medical treatment.  Are pregnant or breastfeeding.  What are the signs or symptoms? Symptoms vary depending on the cause of the vaginitis. Common symptoms include:  Abnormal vaginal discharge. ? The discharge is white, gray, or yellow with bacterial vaginosis. ? The discharge is thick, white, and cheesy with a yeast infection. ? The discharge is frothy and yellow or greenish with trichomoniasis.  A bad vaginal smell. The smell is fishy with bacterial vaginosis.  Vaginal itching, pain, or swelling.  Sex that is painful.  Pain or burning when urinating.  Sometimes there are no symptoms. How is this diagnosed? This condition is diagnosed based on your symptoms and medical history. A physical exam, including a pelvic exam, will also be done. You may also have other tests, including:  Tests to determine the pH level (acidity or alkalinity) of your vagina.  A whiff test, to assess the odor that results when a sample of your vaginal discharge is mixed with a potassium hydroxide solution.  Tests of vaginal fluid. A sample will be examined under a microscope.  How is this treated? Treatment varies depending on the type of vaginitis you have. Your treatment may include:  Antibiotic creams or pills to treat bacterial vaginosis and trichomoniasis.  Antifungal medicines, such as vaginal creams or suppositories, to treat a yeast infection.  Medicine to ease discomfort if you have viral vaginitis. Your sexual partner should also be treated.  Estrogen delivered in a cream, pill, suppository, or vaginal ring to treat atrophic vaginitis. If  vaginal dryness occurs, lubricants and moisturizing creams may help. You may need to avoid scented soaps, sprays, or douches.  Stopping use of a product that is causing allergic vaginitis. Then using a vaginal cream to treat the  symptoms.  Follow these instructions at home: Lifestyle  Keep your genital area clean and dry. Avoid soap, and only rinse the area with water.  Do not douche or use tampons until your health care provider says it is okay to do so. Use sanitary pads, if needed.  Do not have sex until your health care provider approves. When you can return to sex, practice safe sex and use condoms.  Wipe from front to back. This avoids the spread of bacteria from the rectum to the vagina. General instructions  Take over-the-counter and prescription medicines only as told by your health care provider.  If you were prescribed an antibiotic medicine, take or use it as told by your health care provider. Do not stop taking or using the antibiotic even if you start to feel better.  Keep all follow-up visits as told by your health care provider. This is important. How is this prevented?  Use mild, non-scented products. Do not use things that can irritate the vagina, such as fabric softeners. Avoid the following products if they are scented: ? Feminine sprays. ? Detergents. ? Tampons. ? Feminine hygiene products. ? Soaps or bubble baths.  Let air reach your genital area. ? Wear cotton underwear to reduce moisture buildup. ? Avoid wearing underwear while you sleep. ? Avoid wearing tight pants and underwear or nylons without a cotton panel. ? Avoid wearing thong underwear.  Take off any wet clothing, such as bathing suits, as soon as possible.  Practice safe sex and use condoms. Contact a health care provider if:  You have abdominal pain.  You have a fever.  You have symptoms that last for more than 2-3 days. Get help right away if:  You have a fever and your symptoms suddenly get worse. Summary  Vaginitis is a condition in which the vaginal tissue becomes inflamed.This condition is most often caused by a change in the normal balance of bacteria and yeast that live in the vagina.  Treatment  varies depending on the type of vaginitis you have.  Do not douche, use tampons , or have sex until your health care provider approves. When you can return to sex, practice safe sex and use condoms. This information is not intended to replace advice given to you by your health care provider. Make sure you discuss any questions you have with your health care provider. Document Released: 03/21/2007 Document Revised: 06/29/2016 Document Reviewed: 06/29/2016 Elsevier Interactive Patient Education  [employer].

## 2018-01-09 LAB — CERVICOVAGINAL ANCILLARY ONLY
Bacterial vaginitis: POSITIVE — AB
Candida vaginitis: NEGATIVE
Chlamydia: POSITIVE — AB
Neisseria Gonorrhea: NEGATIVE
TRICH (WINDOWPATH): POSITIVE — AB

## 2018-02-24 ENCOUNTER — Encounter: Payer: Self-pay | Admitting: Family Medicine

## 2018-02-24 ENCOUNTER — Telehealth: Payer: Self-pay

## 2018-02-24 ENCOUNTER — Ambulatory Visit: Payer: BLUE CROSS/BLUE SHIELD | Admitting: Family Medicine

## 2018-02-24 VITALS — BP 120/60 | HR 79 | Temp 98.1°F | Wt 184.5 lb

## 2018-02-24 DIAGNOSIS — A749 Chlamydial infection, unspecified: Secondary | ICD-10-CM

## 2018-02-24 DIAGNOSIS — A599 Trichomoniasis, unspecified: Secondary | ICD-10-CM

## 2018-02-24 MED ORDER — TINIDAZOLE 500 MG PO TABS: 2.0000 g | ORAL_TABLET | Freq: Every day | ORAL | 0 refills | Status: DC

## 2018-02-24 MED ORDER — AZITHROMYCIN 500 MG PO TABS: ORAL_TABLET | ORAL | 0 refills | Status: DC

## 2018-02-24 NOTE — Telephone Encounter (Signed)
Patient was seen in the office today and states that she was unaware of lab results  from 01/06/18. In light of this Patient did not feel that she needed to be re-evaluated. Patient has been given notes per Dr. Regis Bill. Dr. Ethlyn Gallery agreed with course of treatment. Prescriptions have been sent in.

## 2018-02-24 NOTE — Progress Notes (Signed)
When patient arrived for appointment, it was realized that her previous testing results for positive chlamydia and trichomonas testing were not discussed with her. Results were relayed by Ria Comment to patient and treatment sent in for her according to previous result note.   No office visit completed today.

## 2018-03-13 ENCOUNTER — Telehealth: Payer: Self-pay | Admitting: *Deleted

## 2018-03-13 NOTE — Telephone Encounter (Signed)
Recommend appointment with Diamond Hansen or her gynecologist. This could be yeast from the antibiotic she took. If no white discharge, pain, fever, etc, she can schedule appt after period if she wishes.

## 2018-03-13 NOTE — Telephone Encounter (Signed)
  03/13/2018-I called the pt and she stated she finished the treatment advised for last positive tests on 8/5 and still complains of an itchy, burning sensation on the outer vaginal area and questioned what to do?  I advised she come in for another appt and she stated her cycle started 2 days ago and message was forwarded to Dr Maudie Mercury.  She stated it is Ok to leave a detailed message on the voicemail.  Copied from [place] [phone]. Topic: General - Other >> Mar 13, 2018  9:19 AM Carolyn Stare wrote:  Pt ask Mayer Masker a call back  she said she would to speak with Mechele Claude about some test she had

## 2018-03-13 NOTE — Telephone Encounter (Signed)
I called the pt and informed her of the message below.  She stated she will call back for an appt after her cycle ends.

## 2018-06-27 ENCOUNTER — Encounter: Payer: Self-pay | Admitting: Family Medicine

## 2018-06-27 ENCOUNTER — Ambulatory Visit (INDEPENDENT_AMBULATORY_CARE_PROVIDER_SITE_OTHER): Payer: BLUE CROSS/BLUE SHIELD | Admitting: Family Medicine

## 2018-06-27 VITALS — BP 122/74 | HR 85 | Temp 98.5°F | Ht 66.5 in | Wt 180.7 lb

## 2018-06-27 DIAGNOSIS — F4310 Post-traumatic stress disorder, unspecified: Secondary | ICD-10-CM

## 2018-06-27 DIAGNOSIS — F419 Anxiety disorder, unspecified: Secondary | ICD-10-CM | POA: Diagnosis not present
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
## 2018-06-27 NOTE — Patient Instructions (Signed)
BEFORE YOU LEAVE: -follow up: 1-2 months  Call the number provided to set up treatment for anxiety with Dennison Bulla.  I hope you are feeling better soon! Seek care promptly if your symptoms worsen, new concerns arise or you are not improving with treatment.

## 2018-07-06 ENCOUNTER — Ambulatory Visit (INDEPENDENT_AMBULATORY_CARE_PROVIDER_SITE_OTHER): Payer: BLUE CROSS/BLUE SHIELD | Admitting: Psychology

## 2018-07-06 DIAGNOSIS — F4322 Adjustment disorder with anxiety: Secondary | ICD-10-CM | POA: Diagnosis not present

## 2018-07-17 ENCOUNTER — Ambulatory Visit (INDEPENDENT_AMBULATORY_CARE_PROVIDER_SITE_OTHER): Payer: BLUE CROSS/BLUE SHIELD | Admitting: Psychology

## 2018-07-17 DIAGNOSIS — F4322 Adjustment disorder with anxiety: Secondary | ICD-10-CM

## 2018-07-31 ENCOUNTER — Ambulatory Visit (INDEPENDENT_AMBULATORY_CARE_PROVIDER_SITE_OTHER): Payer: BLUE CROSS/BLUE SHIELD | Admitting: Psychology

## 2018-07-31 DIAGNOSIS — F4322 Adjustment disorder with anxiety: Secondary | ICD-10-CM | POA: Diagnosis not present

## 2018-08-14 ENCOUNTER — Ambulatory Visit: Payer: BLUE CROSS/BLUE SHIELD | Admitting: Psychology

## 2018-08-23 ENCOUNTER — Ambulatory Visit: Payer: Self-pay | Admitting: Psychology

## 2018-08-28 ENCOUNTER — Ambulatory Visit (INDEPENDENT_AMBULATORY_CARE_PROVIDER_SITE_OTHER): Payer: BLUE CROSS/BLUE SHIELD | Admitting: Psychology

## 2018-08-28 DIAGNOSIS — F4322 Adjustment disorder with anxiety: Secondary | ICD-10-CM | POA: Diagnosis not present

## 2018-09-12 ENCOUNTER — Ambulatory Visit (INDEPENDENT_AMBULATORY_CARE_PROVIDER_SITE_OTHER): Payer: BLUE CROSS/BLUE SHIELD | Admitting: Psychology

## 2018-09-12 DIAGNOSIS — F4322 Adjustment disorder with anxiety: Secondary | ICD-10-CM | POA: Diagnosis not present

## 2018-09-20 ENCOUNTER — Encounter: Payer: Self-pay | Admitting: Family Medicine

## 2018-09-20 ENCOUNTER — Other Ambulatory Visit: Payer: Self-pay

## 2018-09-20 ENCOUNTER — Ambulatory Visit (INDEPENDENT_AMBULATORY_CARE_PROVIDER_SITE_OTHER): Payer: BLUE CROSS/BLUE SHIELD | Admitting: Family Medicine

## 2018-09-20 VITALS — Resp 12

## 2018-09-20 DIAGNOSIS — N898 Other specified noninflammatory disorders of vagina: Secondary | ICD-10-CM | POA: Diagnosis not present

## 2018-09-20 DIAGNOSIS — Z8619 Personal history of other infectious and parasitic diseases: Secondary | ICD-10-CM

## 2018-09-20 MED ORDER — METRONIDAZOLE 500 MG PO TABS: 2000.0000 mg | ORAL_TABLET | Freq: Once | ORAL | 0 refills | Status: AC

## 2018-09-20 NOTE — Progress Notes (Signed)
Virtual Visit via Video Note   I connected with [name] on 09/20/18 at  2:30 PM EDT by a video enabled telemedicine application and verified that I am speaking with the correct person using two identifiers.  Location patient: home Location provider:work or home office Persons participating in the virtual visit: patient, provider  I discussed the limitations of evaluation and management by telemedicine and the availability of in person appointments. She expressed understanding and agreed to proceed.   HPI: [name] is a 43 yo female complaining about 2-3 days of odorous constant vaginal discharge. Mild pruritus. Vaginal discharge is whitish, thick, she has noted "little blood", and "strong" odor ("fishy").  She thinks she may have another episode of bacterial vaginosis, she has been treated in the past with metronidazole oral tablets and vaginal gel. She has not identified exacerbating or alleviating factors.  She denies fever, chills, unusual fatigue, abdominal pain, nausea, vomiting, urinary symptoms, arthralgias, or skin rash. She has not used OTC medications. Problem seems to be stable.  History of chlamydia and trichomonas, last treated in 02/2018. According to patient, she has been using condoms all the time.  She also states that she has a different sex partner.  LMP 1-2 weeks ago.   ROS: See pertinent positives and negatives per HPI.  Past Medical History:  Diagnosis Date  . Anemia   . GERD (gastroesophageal reflux disease)     Past Surgical History:  Procedure Laterality Date  . TUBAL LIGATION    . UMBILICAL HERNIA REPAIR     age 10    Family History  Problem Relation Age of Onset  . Hypertension Father   . Diabetes Brother     Social History   Socioeconomic History  . Marital status: Single    Spouse name: Not on file  . Number of children: Not on file  . Years of education: Not on file  . Highest education level: Not on file  Occupational History  .  Not on file  Social Needs  . Financial resource strain: Not on file  . Food insecurity:    Worry: Not on file    Inability: Not on file  . Transportation needs:    Medical: Not on file    Non-medical: Not on file  Tobacco Use  . Smoking status: Former Research scientist (life sciences)  . Smokeless tobacco: Never Used  . Tobacco comment: quit in 2011, smoke on and off for 8 years  Substance and Sexual Activity  . Alcohol use: No    Alcohol/week: 0.0 standard drinks  . Drug use: No  . Sexual activity: Never    Partners: Male  Lifestyle  . Physical activity:    Days per week: Not on file    Minutes per session: Not on file  . Stress: Not on file  Relationships  . Social connections:    Talks on phone: Not on file    Gets together: Not on file    Attends religious service: Not on file    Active member of club or organization: Not on file    Attends meetings of clubs or organizations: Not on file    Relationship status: Not on file  . Intimate partner violence:    Fear of current or ex partner: Not on file    Emotionally abused: Not on file    Physically abused: Not on file    Forced sexual activity: Not on file  Other Topics Concern  . Not on file  Social History  Narrative   Work or School: works at [employer]: lives with 3 daughter (88, 62, 37)      Spiritual Beliefs: none      Lifestyle: walks a few days per week; diet is so so             Current Outpatient Medications:  .  amoxicillin (AMOXIL) 500 MG capsule, TK ONE C PO  QID TAT, Disp: , Rfl:  .  Ferrous Sulfate (IRON) 325 (65 Fe) MG TABS, Take by mouth., Disp: , Rfl:  .  Omeprazole (PRILOSEC PO), Take by mouth daily., Disp: , Rfl:   EXAM:  VITALS per patient if applicable:Resp 12   LMP 09/11/2018   GENERAL: alert, oriented, appears well and in no acute distress  HEENT: atraumatic, normocephalic, and conjunttiva clear.  LUNGS: on inspection no signs of respiratory distress, breathing rate appears normal, no  obvious gross SOB, gasping or wheezing  CV: no obvious cyanosis  GU: Deferred for next visit if needed.   MS: moves all visible extremities without noticeable abnormality  PSYCH/NEURO: pleasant and cooperative, no obvious depression or anxiety, speech and thought processing grossly intact  ASSESSMENT AND PLAN:  Discussed the following assessment and plan:  Vaginal discharge - Plan: metroNIDAZOLE (FLAGYL) 500 MG tablet  Hx of trichomonal vaginitis  -We discussed possible etiologies for vaginal discharge. Because prior history of BV and describing odor as "fishy", will treat empirically for bacterial vaginosis. We discussed some side effects of metronidazole, recommend 2 g to take once. She understands to avoid alcohol intake.  -Even though she is reporting "always" using condoms, the fact she has seen "little blood" with vaginal discharge raises the concern of trichomoniasis. Denies pregnancy and had a normal menstrual period a few days ago. Educated about STD prevention. Instructed about warning signs.  She understands that we have opted to treat empirically at this time because current COVID-19 pandemia and stay home recommendations to avoid possible sick contact.  If vaginal discharge is persistent she will need an office visit with a pelvic exam + labs.  I discussed the assessment and treatment plan with the patient. She was provided an opportunity to ask questions and all were answered. The patient agreed with the plan and demonstrated an understanding of the instructions.    Return if symptoms worsen or fail to improve.    Betty Martinique, MD

## 2018-09-26 ENCOUNTER — Ambulatory Visit (INDEPENDENT_AMBULATORY_CARE_PROVIDER_SITE_OTHER): Payer: BLUE CROSS/BLUE SHIELD | Admitting: Psychology

## 2018-09-26 DIAGNOSIS — F4322 Adjustment disorder with anxiety: Secondary | ICD-10-CM | POA: Diagnosis not present

## 2018-09-28 ENCOUNTER — Ambulatory Visit (INDEPENDENT_AMBULATORY_CARE_PROVIDER_SITE_OTHER): Payer: BLUE CROSS/BLUE SHIELD | Admitting: Family Medicine

## 2018-09-28 ENCOUNTER — Other Ambulatory Visit: Payer: Self-pay

## 2018-09-28 DIAGNOSIS — F419 Anxiety disorder, unspecified: Secondary | ICD-10-CM

## 2018-09-28 DIAGNOSIS — F339 Major depressive disorder, recurrent, unspecified: Secondary | ICD-10-CM

## 2018-09-28 MED ORDER — SERTRALINE HCL 50 MG PO TABS: 50.0000 mg | ORAL_TABLET | Freq: Every day | ORAL | 3 refills | Status: DC

## 2018-09-28 NOTE — Progress Notes (Signed)
Virtual Visit via Video Note  I connected with Diamond Hansen  on 09/28/18 at 10:45 AM EDT by a video enabled telemedicine application and verified that I am speaking with the correct person using two identifiers.  Location patient: home Location provider:work or home office Persons participating in the virtual visit: patient, provider  I discussed the limitations of evaluation and management by telemedicine and the availability of in person appointments. The patient expressed understanding and agreed to proceed.   HPI:  Follow up Anxiety: -she was advised to do CBT, she is seeing Dennison Bulla (he actually contacted me about her and advised zoloft may be a good option to add to her tretment) -she works at [employer] and has been stressed with the [place] pandemic, she reports a lot of people don't care -she has had alot of anxiety and some depression a few days per week, anhedonia, lack of motivation, has to force herself to keep going -she has had issues for years -no SI, thoughts of harm, manic symptoms, hallucinations -has a best friend she can talk to  ROS: See pertinent positives and negatives per HPI.  Past Medical History:  Diagnosis Date  . Anemia   . GERD (gastroesophageal reflux disease)     Past Surgical History:  Procedure Laterality Date  . TUBAL LIGATION    . UMBILICAL HERNIA REPAIR     age 43    Family History  Problem Relation Age of Onset  . Hypertension Father   . Diabetes Brother     SOCIAL HX: see hpi   Current Outpatient Medications:  .  Ferrous Sulfate (IRON) 325 (65 Fe) MG TABS, Take by mouth., Disp: , Rfl:  .  Omeprazole (PRILOSEC PO), Take by mouth daily., Disp: , Rfl:  .  sertraline (ZOLOFT) 50 MG tablet, Take 1 tablet (50 mg total) by mouth daily., Disp: 30 tablet, Rfl: 3  EXAM:  VITALS per patient if applicable: There were no vitals filed for this visit. There is no height or weight on file to calculate BMI.   GENERAL: alert, oriented, appears  well and in no acute distress  HEENT: atraumatic, conjunttiva clear, no obvious abnormalities on inspection of external nose and ears  NECK: normal movements of the head and neck  LUNGS: on inspection no signs of respiratory distress, breathing rate appears normal, no obvious gross SOB, gasping or wheezing  CV: no obvious cyanosis  MS: moves all visible extremities without noticeable abnormality  PSYCH/NEURO: pleasant and cooperative, no obvious depression or anxiety, speech and thought processing grossly intact  ASSESSMENT AND PLAN:  Discussed the following assessment and plan:  Depression, recurrent (HCC)  Anxiety   Discussed treatment options and risks. Opted to continue CBT and add zoloft. Follow up in about 3-4 weeks. Sooner as needed.  I discussed the assessment and treatment plan with the patient. The patient was provided an opportunity to ask questions and all were answered. The patient agreed with the plan and demonstrated an understanding of the instructions.   The patient was advised to call back or seek an in-person evaluation if the symptoms worsen or if the condition fails to improve as anticipated.   Follow up instructions: Advised assistant Apolonio Schneiders to help patient arrange the following: -follow up with Dr .Maudie Mercury in about 3 weeks   [name], DO

## 2018-10-10 ENCOUNTER — Ambulatory Visit (INDEPENDENT_AMBULATORY_CARE_PROVIDER_SITE_OTHER): Payer: BLUE CROSS/BLUE SHIELD | Admitting: Psychology

## 2018-10-10 DIAGNOSIS — F4322 Adjustment disorder with anxiety: Secondary | ICD-10-CM | POA: Diagnosis not present

## 2018-10-12 ENCOUNTER — Telehealth: Payer: Self-pay | Admitting: *Deleted

## 2018-10-12 NOTE — Telephone Encounter (Signed)
Per office notes from 4/23, I left a detailed message at the pts cell number to schedule a follow up phone visit with Dr Maudie Mercury.

## 2018-10-17 ENCOUNTER — Telehealth: Payer: Self-pay | Admitting: *Deleted

## 2018-10-17 NOTE — Telephone Encounter (Signed)
-----   Message from [name], DO sent at 10/17/2018 11:05 AM EDT ----- Hi Diamond Hansen,  I see Farrel Gobble in my doxy.me waiting room. Hapy to see her if needed. Can you check to ensure she is trying to see me and get her on the schedule in an open slot? Thanks!

## 2018-10-17 NOTE — Telephone Encounter (Signed)
I left a detailed message at the pts cell number with the information below.  I asked that she call back if she needed a phone visit today as she is scheduled for next Tuesday.

## 2018-10-24 ENCOUNTER — Ambulatory Visit (INDEPENDENT_AMBULATORY_CARE_PROVIDER_SITE_OTHER): Payer: BLUE CROSS/BLUE SHIELD | Admitting: Family Medicine

## 2018-10-24 ENCOUNTER — Ambulatory Visit: Payer: BLUE CROSS/BLUE SHIELD | Admitting: Family Medicine

## 2018-10-24 ENCOUNTER — Other Ambulatory Visit: Payer: Self-pay

## 2018-10-24 ENCOUNTER — Ambulatory Visit (INDEPENDENT_AMBULATORY_CARE_PROVIDER_SITE_OTHER): Payer: BLUE CROSS/BLUE SHIELD | Admitting: Psychology

## 2018-10-24 DIAGNOSIS — G4726 Circadian rhythm sleep disorder, shift work type: Secondary | ICD-10-CM

## 2018-10-24 DIAGNOSIS — F339 Major depressive disorder, recurrent, unspecified: Secondary | ICD-10-CM | POA: Diagnosis not present

## 2018-10-24 DIAGNOSIS — F419 Anxiety disorder, unspecified: Secondary | ICD-10-CM

## 2018-10-24 DIAGNOSIS — F4322 Adjustment disorder with anxiety: Secondary | ICD-10-CM

## 2018-10-24 MED ORDER — SERTRALINE HCL 50 MG PO TABS: 50.0000 mg | ORAL_TABLET | Freq: Every day | ORAL | 1 refills | Status: DC

## 2018-10-24 NOTE — Progress Notes (Signed)
Virtual Visit via Video Note  I connected with Diamond Hansen  on 10/24/18 at  5:50 PM EDT by a video enabled telemedicine application and verified that I am speaking with the correct person using two identifiers.  Location patient: home Location provider:work or home office Persons participating in the virtual visit: patient, provider    HPI:  Follow up Anxiety and Depression: -she reports she has been doing better on the soloft - initially felt weird when she started it, but now is doing better -she feels it has helped her anxiety and depression significantly -continues to see Dennison Bulla for CBT -continues to work night shifts so sleep has been tough, sleep well when she works, but then has difficulty sleeping when switches back to days -working during the pandemic also has its individual stressors   ROS: See pertinent positives and negatives per HPI.  Past Medical History:  Diagnosis Date  . Anemia   . GERD (gastroesophageal reflux disease)     Past Surgical History:  Procedure Laterality Date  . TUBAL LIGATION    . UMBILICAL HERNIA REPAIR     age 43    Family History  Problem Relation Age of Onset  . Hypertension Father   . Diabetes Brother     SOCIAL HX: see hpi   Current Outpatient Medications:  .  Ferrous Sulfate (IRON) 325 (65 Fe) MG TABS, Take by mouth., Disp: , Rfl:  .  Omeprazole (PRILOSEC PO), Take by mouth daily., Disp: , Rfl:  .  sertraline (ZOLOFT) 50 MG tablet, Take 1 tablet (50 mg total) by mouth daily., Disp: 90 tablet, Rfl: 1  EXAM:  VITALS per patient if applicable:  GENERAL: alert, oriented, appears well and in no acute distress  HEENT: atraumatic, conjunttiva clear, no obvious abnormalities on inspection of external nose and ears  NECK: normal movements of the head and neck  LUNGS: on inspection no signs of respiratory distress, breathing rate appears normal, no obvious gross SOB, gasping or wheezing  CV: no obvious cyanosis  MS: moves  all visible extremities without noticeable abnormality  PSYCH/NEURO: pleasant and cooperative, no obvious depression or anxiety, speech and thought processing grossly intact  ASSESSMENT AND PLAN:  Discussed the following assessment and plan:  Depression, recurrent (HCC)  Anxiety  Shift work sleep disorder  Glad has responded well to the ssri. Discussed sleep hygeine, various options for the treatment of sleep issues - advised against sleep aides unless severe or ongoing issues given risks. Continue CBT. CPE with Dr. Ethlyn Gallery in 1-2 months, follow up 4-5 months.   I discussed the assessment and treatment plan with the patient. The patient was provided an opportunity to ask questions and all were answered. The patient agreed with the plan and demonstrated an understanding of the instructions.   The patient was advised to call back or seek an in-person evaluation if the symptoms worsen or if the condition fails to improve as anticipated.   Follow up instructions: Advised assistant Wendie Simmer to help patient arrange the following: -phq9 in epic, forward to me as well -CPE with Dr. Ethlyn Gallery in 1-2 months -follow up Dr. Maudie Mercury 4-5 months.  Diamond Kern, DO

## 2018-10-25 ENCOUNTER — Telehealth: Payer: Self-pay | Admitting: *Deleted

## 2018-10-25 NOTE — Telephone Encounter (Signed)
-----   Message from [name], DO sent at 10/24/2018  6:29 PM EDT ----- -phq9 in epic, forward to me as well -CPE with Dr. Ethlyn Gallery in 1-2 months -follow up Dr. Maudie Mercury 4-5 months.

## 2018-10-25 NOTE — Telephone Encounter (Signed)
I called the pt and scheduled both appts as below.  PHQ-9 questions completed, entered into the chart and message forwarded to Dr Maudie Mercury.

## 2018-11-07 ENCOUNTER — Ambulatory Visit (INDEPENDENT_AMBULATORY_CARE_PROVIDER_SITE_OTHER): Payer: BC Managed Care – PPO | Admitting: Psychology

## 2018-11-07 DIAGNOSIS — F4322 Adjustment disorder with anxiety: Secondary | ICD-10-CM | POA: Diagnosis not present

## 2018-11-21 ENCOUNTER — Ambulatory Visit: Payer: BC Managed Care – PPO | Admitting: Psychology

## 2018-11-27 ENCOUNTER — Other Ambulatory Visit (HOSPITAL_COMMUNITY)
Admission: RE | Admit: 2018-11-27 | Discharge: 2018-11-27 | Disposition: A | Discharge status: Home / Self Care | Payer: BC Managed Care – PPO | Source: Ambulatory Visit | Attending: Family Medicine | Admitting: Family Medicine

## 2018-11-27 ENCOUNTER — Other Ambulatory Visit: Payer: Self-pay

## 2018-11-27 ENCOUNTER — Ambulatory Visit (INDEPENDENT_AMBULATORY_CARE_PROVIDER_SITE_OTHER): Payer: BC Managed Care – PPO | Admitting: Family Medicine

## 2018-11-27 ENCOUNTER — Encounter: Payer: Self-pay | Admitting: Family Medicine

## 2018-11-27 VITALS — BP 108/80 | HR 80 | Temp 98.8°F | Ht 67.0 in | Wt 176.0 lb

## 2018-11-27 DIAGNOSIS — G4700 Insomnia, unspecified: Secondary | ICD-10-CM

## 2018-11-27 DIAGNOSIS — Z8619 Personal history of other infectious and parasitic diseases: Secondary | ICD-10-CM

## 2018-11-27 DIAGNOSIS — Z202 Contact with and (suspected) exposure to infections with a predominantly sexual mode of transmission: Secondary | ICD-10-CM | POA: Diagnosis present

## 2018-11-27 DIAGNOSIS — F419 Anxiety disorder, unspecified: Secondary | ICD-10-CM | POA: Diagnosis not present

## 2018-11-27 DIAGNOSIS — R7301 Impaired fasting glucose: Secondary | ICD-10-CM | POA: Diagnosis not present

## 2018-11-27 DIAGNOSIS — Z0000 Encounter for general adult medical examination without abnormal findings: Secondary | ICD-10-CM

## 2018-11-27 DIAGNOSIS — R1030 Lower abdominal pain, unspecified: Secondary | ICD-10-CM | POA: Diagnosis present

## 2018-11-27 DIAGNOSIS — Z1239 Encounter for other screening for malignant neoplasm of breast: Secondary | ICD-10-CM

## 2018-11-27 DIAGNOSIS — F339 Major depressive disorder, recurrent, unspecified: Secondary | ICD-10-CM

## 2018-11-27 DIAGNOSIS — Z13220 Encounter for screening for lipoid disorders: Secondary | ICD-10-CM | POA: Diagnosis not present

## 2018-11-27 DIAGNOSIS — N920 Excessive and frequent menstruation with regular cycle: Secondary | ICD-10-CM

## 2018-11-27 DIAGNOSIS — D649 Anemia, unspecified: Secondary | ICD-10-CM | POA: Diagnosis not present

## 2018-11-27 DIAGNOSIS — G4726 Circadian rhythm sleep disorder, shift work type: Secondary | ICD-10-CM

## 2018-11-27 LAB — CBC WITH DIFFERENTIAL/PLATELET
Basophils Absolute: 0.1 10*3/uL (ref 0.0–0.1)
Basophils Relative: 0.9 % (ref 0.0–3.0)
Eosinophils Absolute: 0.1 10*3/uL (ref 0.0–0.7)
Eosinophils Relative: 1.7 % (ref 0.0–5.0)
HCT: 38.1 % (ref 36.0–46.0)
Hemoglobin: 12.1 g/dL (ref 12.0–15.0)
Lymphocytes Relative: 36.1 % (ref 12.0–46.0)
Lymphs Abs: 2.8 10*3/uL (ref 0.7–4.0)
MCHC: 31.7 g/dL (ref 30.0–36.0)
MCV: 80.2 fl (ref 78.0–100.0)
Monocytes Absolute: 0.7 10*3/uL (ref 0.1–1.0)
Monocytes Relative: 8.6 % (ref 3.0–12.0)
Neutro Abs: 4.1 10*3/uL (ref 1.4–7.7)
Neutrophils Relative %: 52.7 % (ref 43.0–77.0)
Platelets: 442 10*3/uL — ABNORMAL HIGH (ref 150.0–400.0)
RBC: 4.75 Mil/uL (ref 3.87–5.11)
RDW: 16.5 % — ABNORMAL HIGH (ref 11.5–15.5)
WBC: 7.7 10*3/uL (ref 4.0–10.5)

## 2018-11-27 LAB — HEMOGLOBIN A1C: Hgb A1c MFr Bld: 5.7 % (ref 4.6–6.5)

## 2018-11-27 LAB — TSH: TSH: 0.4 u[IU]/mL (ref 0.35–4.50)

## 2018-11-27 MED ORDER — TRAZODONE HCL 50 MG PO TABS: 25.0000 mg | ORAL_TABLET | Freq: Every evening | ORAL | 3 refills | Status: DC | PRN

## 2018-11-27 NOTE — Progress Notes (Signed)
Diamond Hansen DOB: March 20, 1976 Encounter date: 11/27/2018  This is a 43 y.o. female who presents for complete physical   History of present illness/Additional concerns: Last pap was 07/2016 w Dr. Talbert Nan. Negative HPV and normal lpap. Did take treatment back in September for Chlamydia, trich. Single partner she was exposed to. No current symptoms. Would like to be retested for clearance and also for other STDs. Periods are heavy first 3-4 days and last 5 days total. Usually wears 2 pads to capture all of bleeding. Can change every hour. Have always been like this. Last couple of cycles have been a little better.   mammogram: not had one yet.   GERD: prilosec 20mg  daily just if needed  Anemia: ferrous sulfate 325mg   Anxiety: zoloft 50mg  daily; this has helped with anxiety. Follows with Dennison Bulla. Works third shift; not sleeping well at all. Works 4 days on and 3 days off. Last week was first week she couldn't sleep at all. Tried melatonin which worked for her for [employer]. 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 Usually meeting with Lanny Hurst every other week. Has tried benadryl, has tried zquil. Heart rate speeds up with anti-histamine.   Past Medical History:  Diagnosis Date  . Anemia   . GERD (gastroesophageal reflux disease)    Past Surgical History:  Procedure Laterality Date  . TUBAL LIGATION    . UMBILICAL HERNIA REPAIR     age 76   No Known Allergies Current Meds  Medication Sig  . Ferrous Sulfate (IRON) 325 (65 Fe) MG TABS Take by mouth.  . Omeprazole (PRILOSEC PO) Take by mouth daily.  . sertraline (ZOLOFT) 50 MG tablet Take 1 tablet (50 mg total) by mouth daily.   Social History   Tobacco Use  . Smoking status: Former Research scientist (life sciences)  . Smokeless tobacco: Never Used  . Tobacco comment: quit in 2011, smoke on and off for 8 years  Substance Use Topics  . Alcohol use: No    Alcohol/week: 0.0 standard drinks   Family History   Problem Relation Age of Onset  . Hypertension Father   . Cancer Father        unknown type  . Diabetes Brother   . High blood pressure Mother   . Diabetes Maternal Grandmother   . High blood pressure Maternal Grandmother   . Diabetes Maternal Grandfather   . High blood pressure Maternal Grandfather   . Diabetes Paternal Grandmother   . High blood pressure Paternal Grandmother   . Diabetes Paternal Grandfather   . High blood pressure Paternal Grandfather   . Multiple sclerosis Paternal Uncle      Review of Systems  Constitutional: Negative for activity change, appetite change, chills, fatigue, fever and unexpected weight change.  HENT: Negative for congestion, ear pain, hearing loss, sinus pressure, sinus pain, sore throat and trouble swallowing.   Eyes: Negative for pain and visual disturbance.  Respiratory: Negative for cough, chest tightness, shortness of breath and wheezing.   Cardiovascular: Negative for chest pain, palpitations and leg swelling.  Gastrointestinal: Negative for abdominal pain, blood in stool, constipation, diarrhea, nausea and vomiting.  Genitourinary: Negative for difficulty urinating, menstrual problem (periods have always been heavy) and pelvic pain.  Musculoskeletal: Negative for arthralgias and back pain.  Skin: Negative for rash.  Neurological: Negative for dizziness, weakness, numbness and headaches.  Hematological: Negative for adenopathy. Does not bruise/bleed easily.  Psychiatric/Behavioral: Negative for sleep disturbance and suicidal ideas.  The patient is not nervous/anxious.     CBC:  Lab Results  Component Value Date   WBC 7.4 07/28/2016   HGB 13.2 07/28/2016   HCT 40.9 07/28/2016   MCH 26.6 (L) 07/28/2016   MCHC 32.3 07/28/2016   RDW 14.3 07/28/2016   PLT 436 (H) 07/28/2016   MPV 9.0 07/28/2016   CMP: Lab Results  Component Value Date   NA 139 10/09/2015   K 4.0 10/09/2015   CL 105 10/09/2015   CO2 26 10/09/2015   GLUCOSE 106  (H) 10/09/2015   BUN 10 10/09/2015   CREATININE 0.77 10/09/2015   CALCIUM 9.2 10/09/2015   PROT 7.3 10/09/2015   BILITOT 0.3 10/09/2015   ALKPHOS 58 10/09/2015   ALT 8 10/09/2015   AST 9 10/09/2015   LIPID: Lab Results  Component Value Date   CHOL 157 06/05/2015   TRIG 73.0 06/05/2015   HDL 71.10 06/05/2015   LDLCALC 72 06/05/2015    Objective:  BP 108/80 (BP Location: Left Arm, Patient Position: Sitting, Cuff Size: Normal)   Pulse 80   Temp 98.8 F (37.1 C) (Oral)   Ht 5\' 7"  (1.702 m)   Wt 176 lb (79.8 kg)   SpO2 99%   BMI 27.57 kg/m   Weight: 176 lb (79.8 kg)   BP Readings from Last 3 Encounters:  11/27/18 108/80  06/27/18 122/74  02/24/18 120/60   Wt Readings from Last 3 Encounters:  11/27/18 176 lb (79.8 kg)  06/27/18 180 lb 11.2 oz (82 kg)  02/24/18 184 lb 8 oz (83.7 kg)    Physical Exam Constitutional:      General: She is not in acute distress.    Appearance: She is well-developed.  HENT:     Head: Normocephalic and atraumatic.     Right Ear: External ear normal.     Left Ear: External ear normal.     Mouth/Throat:     Pharynx: No oropharyngeal exudate.  Eyes:     Conjunctiva/sclera: Conjunctivae normal.     Pupils: Pupils are equal, round, and reactive to light.  Neck:     Musculoskeletal: Normal range of motion and neck supple.     Thyroid: No thyromegaly.  Cardiovascular:     Rate and Rhythm: Normal rate and regular rhythm.     Heart sounds: Normal heart sounds. No murmur. No friction rub. No gallop.   Pulmonary:     Effort: Pulmonary effort is normal.     Breath sounds: Normal breath sounds.  Chest:     Breasts:        Right: Normal. No swelling, bleeding, inverted nipple, mass or nipple discharge.        Left: Normal. No swelling, bleeding, inverted nipple, mass or nipple discharge.  Abdominal:     General: Bowel sounds are normal. There is no distension.     Palpations: Abdomen is soft. There is no mass.     Tenderness: There is  abdominal tenderness in the suprapubic area. There is no guarding.     Hernia: No hernia is present.  Genitourinary:    Vagina: Normal.     Cervix: Discharge and cervical bleeding present. No cervical motion tenderness.     Uterus: Enlarged (posterior fibroid palpable).      Adnexa: Right adnexa normal and left adnexa normal.  Musculoskeletal: Normal range of motion.        General: No tenderness or deformity.  Lymphadenopathy:     Cervical: No cervical adenopathy.  Skin:  General: Skin is warm and dry.     Findings: No rash.  Neurological:     Mental Status: She is alert and oriented to person, place, and time.     Deep Tendon Reflexes: Reflexes normal.     Reflex Scores:      Tricep reflexes are 2+ on the right side and 2+ on the left side.      Bicep reflexes are 2+ on the right side and 2+ on the left side.      Brachioradialis reflexes are 2+ on the right side and 2+ on the left side.      Patellar reflexes are 2+ on the right side and 2+ on the left side. Psychiatric:        Speech: Speech normal.        Behavior: Behavior normal.        Thought Content: Thought content normal.     Assessment/Plan: There are no preventive care reminders to display for this patient. Health Maintenance reviewed.  1. Preventative health care Discussed importance of regular exercise - help with physical and mental health.  2. Insomnia, unspecified type Trial trazodone to help with sleep.  - traZODone (DESYREL) 50 MG tablet; Take 0.5-1 tablets (25-50 mg total) by mouth at bedtime as needed for sleep.  Dispense: 30 tablet; Refill: 3  3. Anxiety Improved with zoloft, counseling. Continue with these.   4. Depression, recurrent (HCC) Stable.   5. Shift work sleep disorder See above.   6. Hx of trichomonal vaginitis retested today. No new exposure to infection. Completed treatment as directed.  7. STD exposure - HIV Antibody (routine testing w rflx); Future - RPR; Future - Hep C  Antibody; Future  8. Anemia, unspecified type - CBC with Differential/Platelet; Future - IBC + Ferritin; Future  9. Elevated fasting glucose - Comprehensive metabolic panel; Future - Hemoglobin A1c; Future  10. Screening for breast cancer - MM DIGITAL SCREENING BILATERAL; Future  11. Lipid screening - Lipid panel; Future  12. Menorrhagia with regular cycle - TSH; Future  13. Lower abdominal pain - Urinalysis w microscopic + reflex cultur   Has f/u appointment set with Dr. Maudie Mercury in September (doxy)  Micheline Rough, MD

## 2018-11-27 NOTE — Addendum Note (Signed)
Addended by: Gwynne Edinger on: 11/27/2018 11:18 AM   Modules accepted: Orders

## 2018-11-27 NOTE — Patient Instructions (Signed)
It was nice meeting you today! I'll be in touch with you as soon as I see your lab results come back through the system.     Why is Exercise Important? If I told you I had a single pill that would help you decrease stress by improving anxiety, decreasing depression, help you achieve a healthy weight, give you more energy, make you more productive, help you focus, decrease your risk of dementia/heart attack/stroke/falls, improve your bone health, and more would you be interested? These are just some of the benefits that exercise brings to you. IT IS WORTH carving out some time every day to fit in exercise. It will help in every aspect of your health. Even if you have injuries that prevent you from participating in a type of exercise you used to do; there is always something that you can do to keep exercise a part of your life. If improving your health is important, make exercise your priority. It is worth the time! If you have questions about the type of exercise that is right for you, please talk with me about this!     Exercising to Stay Healthy  Exercising regularly is important. It has many health benefits, such as:  Improving your overall fitness, flexibility, and endurance.  Increasing your bone density.  Helping with weight control.  Decreasing your body fat.  Increasing your muscle strength.  Reducing stress and tension.  Improving your overall health.   In order to become healthy and stay healthy, it is recommended that you do moderate-intensity and vigorous-intensity exercise. You can tell that you are exercising at a moderate intensity if you have a higher heart rate and faster breathing, but you are still able to hold a conversation. You can tell that you are exercising at a vigorous intensity if you are breathing much harder and faster and cannot hold a conversation while exercising. How often should I exercise? Choose an activity that you enjoy and set realistic goals. Your  health care provider can help you to make an activity plan that works for you. Exercise regularly as directed by your health care provider. This may include:  Doing resistance training twice each week, such as: ? Push-ups. ? Sit-ups. ? Lifting weights. ? Using resistance bands.  Doing a given intensity of exercise for a given amount of time. Choose from these options: ? 150 minutes of moderate-intensity exercise every week. ? 75 minutes of vigorous-intensity exercise every week. ? A mix of moderate-intensity and vigorous-intensity exercise every week.   Children, pregnant women, people who are out of shape, people who are overweight, and older adults may need to consult a health care provider for individual recommendations. If you have any sort of medical condition, be sure to consult your health care provider before starting a new exercise program. What are some exercise ideas? Some moderate-intensity exercise ideas include:  Walking at a rate of 1 mile in 15 minutes.  Biking.  Hiking.  Golfing.  Dancing.   Some vigorous-intensity exercise ideas include:  Walking at a rate of at least 4.5 miles per hour.  Jogging or running at a rate of 5 miles per hour.  Biking at a rate of at least 10 miles per hour.  Lap swimming.  Roller-skating or in-line skating.  Cross-country skiing.  Vigorous competitive sports, such as football, basketball, and soccer.  Jumping rope.  Aerobic dancing.   What are some everyday activities that can help me to get exercise?  [place] work, such as: ?  Pushing a Conservation officer, nature. ? Raking and bagging leaves.  Washing and waxing your car.  Pushing a stroller.  Shoveling snow.  Gardening.  Washing windows or floors. How can I be more active in my day-to-day activities?  Use the stairs instead of the elevator.  Take a walk during your lunch break.  If you drive, park your car farther away from work or school.  If you take public  transportation, get off one stop early and walk the rest of the way.  Make all of your phone calls while standing up and walking around.  Get up, stretch, and walk around every 30 minutes throughout the day. What guidelines should I follow while exercising?  Do not exercise so much that you hurt yourself, feel dizzy, or get very short of breath.  Consult your health care provider before starting a new exercise program.  Wear comfortable clothes and shoes with good support.  Drink plenty of water while you exercise to prevent dehydration or heat stroke. Body water is lost during exercise and must be replaced.  Work out until you breathe faster and your heart beats faster. This information is not intended to replace advice given to you by your health care provider. Make sure you discuss any questions you have with your health care provider.

## 2018-11-28 LAB — COMPREHENSIVE METABOLIC PANEL
ALT: 9 U/L (ref 0–35)
AST: 10 U/L (ref 0–37)
Albumin: 4.2 g/dL (ref 3.5–5.2)
Alkaline Phosphatase: 69 U/L (ref 39–117)
BUN: 7 mg/dL (ref 6–23)
CO2: 24 mEq/L (ref 19–32)
Calcium: 8.9 mg/dL (ref 8.4–10.5)
Chloride: 100 mEq/L (ref 96–112)
Creatinine, Ser: 0.76 mg/dL (ref 0.40–1.20)
GFR: 101.53 mL/min (ref >60.00)
Glucose, Bld: 98 mg/dL (ref 70–99)
Potassium: 4 mEq/L (ref 3.5–5.1)
Sodium: 135 mEq/L (ref 135–145)
Total Bilirubin: 0.2 mg/dL (ref 0.2–1.2)
Total Protein: 6.8 g/dL (ref 6.0–8.3)

## 2018-11-28 LAB — CERVICOVAGINAL ANCILLARY ONLY
Bacterial vaginitis: POSITIVE — AB
Candida vaginitis: NEGATIVE
Chlamydia: NEGATIVE
Neisseria Gonorrhea: NEGATIVE
Trichomonas: NEGATIVE

## 2018-11-28 LAB — LIPID PANEL
Cholesterol: 158 mg/dL (ref 0–200)
HDL: 74 mg/dL (ref >39.00)
LDL Cholesterol: 66 mg/dL (ref 0–99)
NonHDL: 83.92
Total CHOL/HDL Ratio: 2
Triglycerides: 90 mg/dL (ref 0.0–149.0)
VLDL: 18 mg/dL (ref 0.0–40.0)

## 2018-11-28 LAB — IBC + FERRITIN
Ferritin: 8 ng/mL — ABNORMAL LOW (ref 10.0–291.0)
Iron: 20 ug/dL — ABNORMAL LOW (ref 42–145)
Saturation Ratios: 4 % — ABNORMAL LOW (ref 20.0–50.0)
Transferrin: 360 mg/dL (ref 212.0–360.0)

## 2018-11-28 LAB — HEPATITIS C ANTIBODY
Hepatitis C Ab: NONREACTIVE
SIGNAL TO CUT-OFF: 0.01 (ref <1.00)

## 2018-11-28 LAB — RPR: RPR Ser Ql: NONREACTIVE

## 2018-11-28 LAB — HIV ANTIBODY (ROUTINE TESTING W REFLEX): HIV 1&2 Ab, 4th Generation: NONREACTIVE

## 2018-11-29 ENCOUNTER — Other Ambulatory Visit: Payer: Self-pay | Admitting: Family Medicine

## 2018-11-29 LAB — URINE CULTURE
MICRO NUMBER:: 597946
Result:: NO GROWTH
SPECIMEN QUALITY:: ADEQUATE

## 2018-11-29 LAB — URINALYSIS W MICROSCOPIC + REFLEX CULTURE
Bacteria, UA: NONE SEEN /HPF
Bilirubin Urine: NEGATIVE
Glucose, UA: NEGATIVE
Hgb urine dipstick: NEGATIVE
Hyaline Cast: NONE SEEN /LPF
Nitrites, Initial: NEGATIVE
Protein, ur: NEGATIVE
Specific Gravity, Urine: 1.022 (ref 1.001–1.03)
Squamous Epithelial / HPF: NONE SEEN /HPF (ref <=5)
pH: <=5 (ref 5.0–8.0)

## 2018-11-29 LAB — CULTURE INDICATED

## 2018-11-29 MED ORDER — METRONIDAZOLE 500 MG PO TABS: 500.0000 mg | ORAL_TABLET | Freq: Two times a day (BID) | ORAL | 0 refills | Status: AC

## 2018-12-25 ENCOUNTER — Other Ambulatory Visit: Payer: Self-pay | Admitting: Family Medicine

## 2018-12-25 ENCOUNTER — Telehealth: Payer: Self-pay | Admitting: Family Medicine

## 2018-12-25 DIAGNOSIS — Z20822 Contact with and (suspected) exposure to covid-19: Secondary | ICD-10-CM

## 2018-12-25 NOTE — Telephone Encounter (Signed)
It would be fine for her to increase to 2 tablets (100mg  sertraline) daily. Make sure no thoughts of self harm. See if you can get more description of symptoms she is dealing with (anxiety/depression) and then schedule 2 week follow up with Dr. Maudie Mercury or myself doxy to touch base. Let her know to reach out sooner if any worsening mood.

## 2018-12-25 NOTE — Telephone Encounter (Signed)
Pt stating that she would like to see if she can up her sertraline as when she had spoke with Dr. Ethlyn Gallery at her CPE visit.  Pt would like to have a call back because she state with her being at home for quarantine she is having a hard time.

## 2018-12-25 NOTE — Telephone Encounter (Signed)
I left a message for the pt to return my call.  CRM also created. 

## 2018-12-26 NOTE — Telephone Encounter (Signed)
Patient called back and was informed of the message below.  Patient declines any thoughts of self harm and stated she thinks she feels different because her routine has changed.  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  States she has sweats every morning, noticed changes in her sleep pattern, Trazodone did not help, and complains of nausea for the past 5 days.  Stated she would prefer not to wait until 2 weeks for a follow up visit and an appt was scheduled for tomorrow morning at 8am.  Message sent to Dr Ethlyn Gallery as Juluis Rainier.

## 2018-12-27 ENCOUNTER — Encounter: Payer: Self-pay | Admitting: Family Medicine

## 2018-12-27 ENCOUNTER — Other Ambulatory Visit: Payer: Self-pay

## 2018-12-27 ENCOUNTER — Ambulatory Visit (INDEPENDENT_AMBULATORY_CARE_PROVIDER_SITE_OTHER): Payer: BC Managed Care – PPO | Admitting: Family Medicine

## 2018-12-27 DIAGNOSIS — F419 Anxiety disorder, unspecified: Secondary | ICD-10-CM | POA: Diagnosis not present

## 2018-12-27 DIAGNOSIS — G4700 Insomnia, unspecified: Secondary | ICD-10-CM | POA: Diagnosis not present

## 2018-12-27 DIAGNOSIS — F339 Major depressive disorder, recurrent, unspecified: Secondary | ICD-10-CM | POA: Diagnosis not present

## 2018-12-27 NOTE — Progress Notes (Signed)
Virtual Visit via Video Note  I connected with Diamond Hansen  on 12/27/18 at  8:00 AM EDT by a video enabled telemedicine application and verified that I am speaking with the correct person using two identifiers.  Location patient: home Location provider:work or home office Persons participating in the virtual visit: patient, provider  I discussed the limitations of evaluation and management by telemedicine and the availability of in person appointments. The patient expressed understanding and agreed to proceed.   Diamond Hansen DOB: 04/30/1976 Encounter date: 12/27/2018  This is a 43 y.o. female who presents with Chief Complaint  Patient presents with  . Anxiety    patient states she feels different since being quarantined at home due to daughter testing positive with COVID  . Depression    History of present illness: Patient had called office to ask about increasing sertraline. Increased stress with daughter testing + for COVID. Last visit was with me on 6/22 for physical. We had discussed anxiety at that time and zoloft 50mg  was helping with that in addition to following with Dennison Bulla.   Daughter tested positive for COVD after going to [place] with friends. Got alcohol poisoning and had to go to hospital. When she came back home had fever, got sick, then got tested and it was positive. Diamond Hansen was upset because she didn't even want daughter to go in first place. She talked with manager because she wanted to make sure that daughter was ok. She was told that she had to quarantine for 10 days because of daughter being positive. Manager seemed upset about this. After being stuck in quarantine felt like she got messed up with sleep cycle. Last week Thursday/friday felt like she changed. Just waking up with cold sweats. Trazodone isn't working for her. Not helping. Body felt different but didn't sleep with it. Felt like body was used to routine and then it all changed when she wasn't doing her  regular night shift 4 days/3 day pattern.  Only thing she has is nausea. No other symptoms right now. Just not sleeping. She does tend to overthink things.   Daughter is fine now - has recovered. Hasn't gotten her follow up testing results. Diamond Hansen had to test as well for work. Hasn't gotten results back yet. Doesn't go back to work until 27th.   Now feels like she can't stop mind from processing. Hasn't tried anything else for sleep.   She did sleep last night. She feels she will feel back to herself when she is back at work and back to 3rd shift. Melatonin hasn't been helpful for her to sleep. Does seem like she sleeps better when she is on her 4 nights work and she takes during that time. Doesn't work between shifts.   Did great work out yesterday - did hard boxing work out and this seemed to make her sleep well.   Was taking the zoloft 50mg  twice daily but has now changed to taking all at one time.     No Known Allergies Current Meds  Medication Sig  . Ferrous Sulfate (IRON) 325 (65 Fe) MG TABS Take by mouth.  . Omeprazole (PRILOSEC PO) Take by mouth daily.  . sertraline (ZOLOFT) 50 MG tablet Take 1 tablet (50 mg total) by mouth daily. (Patient taking differently: Take 100 mg by mouth daily. )  . [DISCONTINUED] traZODone (DESYREL) 50 MG tablet Take 0.5-1 tablets (25-50 mg total) by mouth at bedtime as needed for sleep.    Review of Systems  Constitutional: Negative for chills, fatigue and fever.  Respiratory: Negative for cough, chest tightness, shortness of breath and wheezing.   Cardiovascular: Negative for chest pain, palpitations and leg swelling.  Psychiatric/Behavioral: Positive for sleep disturbance. Negative for hallucinations and suicidal ideas. The patient is nervous/anxious.     Objective:  There were no vitals taken for this visit.      BP Readings from Last 3 Encounters:  11/27/18 108/80  06/27/18 122/74  02/24/18 120/60   Wt Readings from Last 3 Encounters:   11/27/18 176 lb (79.8 kg)  06/27/18 180 lb 11.2 oz (82 kg)  02/24/18 184 lb 8 oz (83.7 kg)    EXAM:  GENERAL: alert, oriented, appears well and in no acute distress  HEENT: atraumatic, conjunctiva clear, no obvious abnormalities on inspection of external nose and ears  NECK: normal movements of the head and neck  LUNGS: on inspection no signs of respiratory distress, breathing rate appears normal, no obvious gross SOB, gasping or wheezing  CV: no obvious cyanosis  MS: moves all visible extremities without noticeable abnormality  PSYCH/NEURO: pleasant and cooperative, no obvious depression or anxiety, speech and thought processing grossly intact  Assessment/Plan  1. Anxiety Prefers to work on daily exercise and will call to talk with Lanny Hurst. We discussed trying to limit medications where possible as this is her preference. She has already increased the zoloft to 100mg  so we will continue this.  2. Depression, recurrent ([place]) See above. Mood is stable. We have discussed and feel that she will feel better once back at work on her regular schedule. She has already increased zoloft to 100mg .  3. Insomnia, unspecified type Sleeping ok on night shift nights; sleep better with exercise. She is going to try to keep up with boxing/daily exercise to help her with sleep. Does not want to take anything else for sleep at this point. Melatonin helps on nights she is doing night shift.    Return mychart update in 1-2 weeks; follow up is scheduled with Dr. Maudie Mercury in September..   I discussed the assessment and treatment plan with the patient. The patient was provided an opportunity to ask questions and all were answered. The patient agreed with the plan and demonstrated an understanding of the instructions.   The patient was advised to call back or seek an in-person evaluation if the symptoms worsen or if the condition fails to improve as anticipated.  I provided 30 minutes of non-face-to-face  time during this encounter.   Micheline Rough, MD

## 2018-12-28 ENCOUNTER — Telehealth: Payer: Self-pay | Admitting: *Deleted

## 2018-12-28 LAB — NOVEL CORONAVIRUS, NAA: SARS-CoV-2, NAA: NOT DETECTED

## 2018-12-28 LAB — SPECIMEN STATUS REPORT

## 2018-12-28 NOTE — Telephone Encounter (Signed)
-----   Message from Caren Macadam, MD sent at 12/27/2018  8:45 AM EDT ----- Can you help with signing up for mychart so that she can update me with mood? Another option for her is to do a 2 week doxy follow up with [facility] just to check in with mood/sleep if she would like.

## 2018-12-28 NOTE — Telephone Encounter (Signed)
I called the pt and left a detailed message stating the link was sent to her email for a Mychart acct and if instead she prefers to call back for a virtual visit with Dr Maudie Mercury.

## 2018-12-29 ENCOUNTER — Ambulatory Visit (INDEPENDENT_AMBULATORY_CARE_PROVIDER_SITE_OTHER): Payer: BC Managed Care – PPO | Admitting: Psychology

## 2018-12-29 DIAGNOSIS — F4322 Adjustment disorder with anxiety: Secondary | ICD-10-CM | POA: Diagnosis not present

## 2019-01-09 ENCOUNTER — Ambulatory Visit (INDEPENDENT_AMBULATORY_CARE_PROVIDER_SITE_OTHER): Payer: BC Managed Care – PPO | Admitting: Psychology

## 2019-01-09 DIAGNOSIS — F4322 Adjustment disorder with anxiety: Secondary | ICD-10-CM | POA: Diagnosis not present

## 2019-01-22 ENCOUNTER — Ambulatory Visit: Payer: BC Managed Care – PPO | Admitting: Psychology

## 2019-01-22 ENCOUNTER — Other Ambulatory Visit: Payer: Self-pay

## 2019-01-22 ENCOUNTER — Ambulatory Visit
Admission: RE | Admit: 2019-01-22 | Discharge: 2019-01-22 | Disposition: A | Discharge status: Home / Self Care | Payer: BC Managed Care – PPO | Source: Ambulatory Visit | Attending: Family Medicine | Admitting: Family Medicine

## 2019-01-22 DIAGNOSIS — Z1239 Encounter for other screening for malignant neoplasm of breast: Secondary | ICD-10-CM

## 2019-01-23 ENCOUNTER — Ambulatory Visit: Payer: Self-pay | Admitting: Psychology

## 2019-01-23 ENCOUNTER — Other Ambulatory Visit: Payer: Self-pay | Admitting: Family Medicine

## 2019-01-23 DIAGNOSIS — R928 Other abnormal and inconclusive findings on diagnostic imaging of breast: Secondary | ICD-10-CM

## 2019-01-26 ENCOUNTER — Other Ambulatory Visit: Payer: Self-pay | Admitting: Family Medicine

## 2019-01-29 NOTE — Telephone Encounter (Signed)
Patient is calling to check status of this request. Patient is completely out of this medication.

## 2019-01-30 ENCOUNTER — Ambulatory Visit
Admission: RE | Admit: 2019-01-30 | Discharge: 2019-01-30 | Disposition: A | Discharge status: Home / Self Care | Payer: BC Managed Care – PPO | Source: Ambulatory Visit | Attending: Family Medicine | Admitting: Family Medicine

## 2019-01-30 ENCOUNTER — Other Ambulatory Visit: Payer: Self-pay | Admitting: Family Medicine

## 2019-01-30 ENCOUNTER — Other Ambulatory Visit: Payer: Self-pay

## 2019-01-30 DIAGNOSIS — R928 Other abnormal and inconclusive findings on diagnostic imaging of breast: Secondary | ICD-10-CM

## 2019-01-30 DIAGNOSIS — N6320 Unspecified lump in the left breast, unspecified quadrant: Secondary | ICD-10-CM

## 2019-01-31 ENCOUNTER — Ambulatory Visit (INDEPENDENT_AMBULATORY_CARE_PROVIDER_SITE_OTHER): Payer: BC Managed Care – PPO | Admitting: Psychology

## 2019-01-31 DIAGNOSIS — F4322 Adjustment disorder with anxiety: Secondary | ICD-10-CM

## 2019-02-05 ENCOUNTER — Ambulatory Visit
Admission: RE | Admit: 2019-02-05 | Discharge: 2019-02-05 | Disposition: A | Discharge status: Home / Self Care | Payer: BC Managed Care – PPO | Source: Ambulatory Visit | Attending: Family Medicine | Admitting: Family Medicine

## 2019-02-05 ENCOUNTER — Ambulatory Visit (INDEPENDENT_AMBULATORY_CARE_PROVIDER_SITE_OTHER): Payer: BC Managed Care – PPO | Admitting: Psychology

## 2019-02-05 ENCOUNTER — Other Ambulatory Visit: Payer: Self-pay

## 2019-02-05 DIAGNOSIS — F4322 Adjustment disorder with anxiety: Secondary | ICD-10-CM | POA: Diagnosis not present

## 2019-02-05 DIAGNOSIS — N6320 Unspecified lump in the left breast, unspecified quadrant: Secondary | ICD-10-CM

## 2019-02-20 ENCOUNTER — Other Ambulatory Visit: Payer: Self-pay | Admitting: Surgery

## 2019-02-20 ENCOUNTER — Ambulatory Visit: Payer: BC Managed Care – PPO | Admitting: Psychology

## 2019-02-20 DIAGNOSIS — N6022 Fibroadenosis of left breast: Secondary | ICD-10-CM

## 2019-02-27 ENCOUNTER — Other Ambulatory Visit: Payer: Self-pay

## 2019-02-27 ENCOUNTER — Ambulatory Visit (INDEPENDENT_AMBULATORY_CARE_PROVIDER_SITE_OTHER): Payer: BC Managed Care – PPO | Admitting: Family Medicine

## 2019-02-27 ENCOUNTER — Encounter: Payer: Self-pay | Admitting: Family Medicine

## 2019-02-27 DIAGNOSIS — D649 Anemia, unspecified: Secondary | ICD-10-CM

## 2019-02-27 DIAGNOSIS — N63 Unspecified lump in unspecified breast: Secondary | ICD-10-CM | POA: Diagnosis not present

## 2019-02-27 DIAGNOSIS — F419 Anxiety disorder, unspecified: Secondary | ICD-10-CM | POA: Diagnosis not present

## 2019-02-27 NOTE — Progress Notes (Signed)
Virtual Visit via Video Note  I connected with Diamond Hansen  on 02/27/19 at 10:00 AM EDT by a video enabled telemedicine application and verified that I am speaking with the correct person using two identifiers.  Location patient: home Location provider:work or home office Persons participating in the virtual visit: patient, provider  I discussed the limitations of evaluation and management by telemedicine and the availability of in person appointments. The patient expressed understanding and agreed to proceed.   HPI:  Here for follow up of several chronic issues - anxiety, overweight BMI, GERD, Anemia. Medications include iron supplementation and Zoloft 50mg . Reports thing have been going better. Feels good - energy has been good. Mood has been better and has had less anxiety. She is undergoing a lumpectomy on her L breast. She has had some anxiety about this. She also is seeing a lot of changes at work and this can be stressful. She has been using trazadone rarely for sleep. She has been thinking about doing some online yoga or exercise as has not ben as active. She thinks meditation would be good. Denies SI, poor energy, panic.   ROS: See pertinent positives and negatives per HPI.  Past Medical History:  Diagnosis Date  . Anemia   . GERD (gastroesophageal reflux disease)     Past Surgical History:  Procedure Laterality Date  . TUBAL LIGATION    . UMBILICAL HERNIA REPAIR     age 22    Family History  Problem Relation Age of Onset  . Hypertension Father   . Cancer Father        unknown type  . Diabetes Brother   . High blood pressure Mother   . Diabetes Maternal Grandmother   . High blood pressure Maternal Grandmother   . Diabetes Maternal Grandfather   . High blood pressure Maternal Grandfather   . Diabetes Paternal Grandmother   . High blood pressure Paternal Grandmother   . Diabetes Paternal Grandfather   . High blood pressure Paternal Grandfather   . Multiple sclerosis  Paternal Uncle     SOCIAL HX: see hpi   Current Outpatient Medications:  .  Ferrous Sulfate (IRON) 325 (65 Fe) MG TABS, Take by mouth., Disp: , Rfl:  .  sertraline (ZOLOFT) 50 MG tablet, TAKE 1 TABLET BY MOUTH EVERY DAY, Disp: 30 tablet, Rfl: 3  EXAM:  VITALS per patient if applicable:  GENERAL: alert, oriented, appears well and in no acute distress  HEENT: atraumatic, conjunttiva clear, no obvious abnormalities on inspection of external nose and ears  NECK: normal movements of the head and neck  LUNGS: on inspection no signs of respiratory distress, breathing rate appears normal, no obvious gross SOB, gasping or wheezing  CV: no obvious cyanosis  MS: moves all visible extremities without noticeable abnormality  PSYCH/NEURO: pleasant and cooperative, no obvious depression or anxiety, speech and thought processing grossly intact  ASSESSMENT AND PLAN:  Discussed the following assessment and plan:  Anxiety  Breast mass  Anemia, unspecified type  -she was considering weaning zoloft, but has a lot of big changes coming up. Opted to continue for now. -continue current treatment -add in meditation, yoga once recovered from the lumpectomy -follow up as scheduled in December   I discussed the assessment and treatment plan with the patient. The patient was provided an opportunity to ask questions and all were answered. The patient agreed with the plan and demonstrated an understanding of the instructions.  Follow up as needed in the interim.  [name], DO

## 2019-03-02 ENCOUNTER — Other Ambulatory Visit: Payer: Self-pay | Admitting: Surgery

## 2019-03-02 DIAGNOSIS — N6022 Fibroadenosis of left breast: Secondary | ICD-10-CM

## 2019-03-13 NOTE — Progress Notes (Signed)
[facility] [place], [address] AT [address] [address] Phone: [phone] Fax: [phone]      Your procedure is scheduled on March 21, 2019.  Report to [facility] Main Entrance "A" at 10:30 A.M., and check in at the Admitting office.  Call this number if you have problems the morning of surgery:  [phone]  Call [phone] if you have any questions prior to your surgery date Monday-Friday 8am-4pm    Remember:  Do not eat or drink after midnight the night before your surgery    Take these medicines the morning of surgery with A SIP OF WATER:  sertraline (ZOLOFT)  7 days prior to surgery STOP taking any Aspirin (unless otherwise instructed by your surgeon), Aleve, Naproxen, Ibuprofen, Motrin, Advil, Goody's, BC's, all herbal medications, fish oil, and all vitamins.    The Morning of Surgery  Do not wear jewelry, make-up or nail polish.  Do not wear lotions, powders, or perfumes or deodorant  Do not shave 48 hours prior to surgery.   Do not bring valuables to the hospital.  [facility] is not responsible for any belongings or valuables.  If you are a smoker, DO NOT Smoke 24 hours prior to surgery IF you wear a CPAP at night please bring your mask, tubing, and machine the morning of surgery   Remember that you must have someone to transport you home after your surgery, and remain with you for 24 hours if you are discharged the same day.   Contacts, glasses, hearing aids, dentures or bridgework may not be worn into surgery.    Leave your suitcase in the car.  After surgery it may be brought to your room.  For patients admitted to the hospital, discharge time will be determined by your treatment team.  Patients discharged the day of surgery will not be allowed to drive home.    Special instructions:   [place]- Preparing For Surgery  Before surgery, you can  play an important role. Because skin is not sterile, your skin needs to be as free of germs as possible. You can reduce the number of germs on your skin by washing with CHG (chlorahexidine gluconate) Soap before surgery.  CHG is an antiseptic cleaner which kills germs and bonds with the skin to continue killing germs even after washing.    Oral Hygiene is also important to reduce your risk of infection.  Remember - BRUSH YOUR TEETH THE MORNING OF SURGERY WITH YOUR REGULAR TOOTHPASTE  Please do not use if you have an allergy to CHG or antibacterial soaps. If your skin becomes reddened/irritated stop using the CHG.  Do not shave (including legs and underarms) for at least 48 hours prior to first CHG shower. It is OK to shave your face.  Please follow these instructions carefully.   1. Shower the NIGHT BEFORE SURGERY and the MORNING OF SURGERY with CHG Soap.   2. If you chose to wash your hair, wash your hair first as usual with your normal shampoo.  3. After you shampoo, rinse your hair and body thoroughly to remove the shampoo.  4. Use CHG as you would any other liquid soap. You can apply CHG directly to the skin and wash gently with a scrungie or a clean washcloth.   5. Apply the CHG Soap to your body ONLY FROM THE NECK DOWN.  Do not use on open  wounds or open sores. Avoid contact with your eyes, ears, mouth and genitals (private parts). Wash Face and genitals (private parts)  with your normal soap.   6. Wash thoroughly, paying special attention to the area where your surgery will be performed.  7. Thoroughly rinse your body with warm water from the neck down.  8. DO NOT shower/wash with your normal soap after using and rinsing off the CHG Soap.  9. Pat yourself dry with a CLEAN TOWEL.  10. Wear CLEAN PAJAMAS to bed the night before surgery, wear comfortable clothes the morning of surgery  11. Place CLEAN SHEETS on your bed the night of your first shower and DO NOT SLEEP WITH  PETS.    Day of Surgery:  Do not apply any deodorants/lotions. Please shower the morning of surgery with the CHG soap  Please wear clean clothes to the hospital/surgery center.   Remember to brush your teeth WITH YOUR REGULAR TOOTHPASTE.   Please read over the following fact sheets that you were given.

## 2019-03-14 ENCOUNTER — Encounter (HOSPITAL_COMMUNITY): Payer: Self-pay

## 2019-03-14 ENCOUNTER — Other Ambulatory Visit: Payer: Self-pay

## 2019-03-14 ENCOUNTER — Encounter (HOSPITAL_COMMUNITY)
Admission: RE | Admit: 2019-03-14 | Discharge: 2019-03-14 | Disposition: A | Discharge status: Home / Self Care | Payer: BC Managed Care – PPO | Source: Ambulatory Visit | Attending: Surgery | Admitting: Surgery

## 2019-03-14 DIAGNOSIS — Z01812 Encounter for preprocedural laboratory examination: Secondary | ICD-10-CM | POA: Diagnosis not present

## 2019-03-14 HISTORY — DX: Anxiety disorder, unspecified: F41.9

## 2019-03-14 LAB — CBC
HCT: 41.1 % (ref 36.0–46.0)
Hemoglobin: 12.8 g/dL (ref 12.0–15.0)
MCH: 25.4 pg — ABNORMAL LOW (ref 26.0–34.0)
MCHC: 31.1 g/dL (ref 30.0–36.0)
MCV: 81.7 fL (ref 80.0–100.0)
Platelets: 435 10*3/uL — ABNORMAL HIGH (ref 150–400)
RBC: 5.03 MIL/uL (ref 3.87–5.11)
RDW: 17.2 % — ABNORMAL HIGH (ref 11.5–15.5)
WBC: 6.5 10*3/uL (ref 4.0–10.5)
nRBC: 0 % (ref 0.0–0.2)

## 2019-03-14 NOTE — Progress Notes (Signed)
PCP - Junell Koberlein   Chest x-ray - n/a EKG - n/a  COVID TEST- Saturday, October 10th   Anesthesia review: n/a  Patient denies shortness of breath, fever, cough and chest pain at PAT appointment   Patient verbalized understanding of instructions that were given to them at the PAT appointment. Patient was also instructed that they will need to review over the PAT instructions again at home before surgery.

## 2019-03-17 ENCOUNTER — Other Ambulatory Visit (HOSPITAL_COMMUNITY)
Admission: RE | Admit: 2019-03-17 | Discharge: 2019-03-17 | Disposition: A | Discharge status: Home / Self Care | Payer: BC Managed Care – PPO | Source: Ambulatory Visit | Attending: Surgery | Admitting: Surgery

## 2019-03-17 DIAGNOSIS — Z20828 Contact with and (suspected) exposure to other viral communicable diseases: Secondary | ICD-10-CM | POA: Diagnosis not present

## 2019-03-17 DIAGNOSIS — Z01812 Encounter for preprocedural laboratory examination: Secondary | ICD-10-CM | POA: Insufficient documentation

## 2019-03-18 LAB — NOVEL CORONAVIRUS, NAA (HOSP ORDER, SEND-OUT TO REF LAB; TAT 18-24 HRS): SARS-CoV-2, NAA: NOT DETECTED

## 2019-03-20 ENCOUNTER — Encounter: Payer: Self-pay | Admitting: Family Medicine

## 2019-03-20 ENCOUNTER — Telehealth (INDEPENDENT_AMBULATORY_CARE_PROVIDER_SITE_OTHER): Payer: BC Managed Care – PPO | Admitting: Family Medicine

## 2019-03-20 ENCOUNTER — Ambulatory Visit
Admission: RE | Admit: 2019-03-20 | Discharge: 2019-03-20 | Disposition: A | Discharge status: Home / Self Care | Payer: BC Managed Care – PPO | Source: Ambulatory Visit | Attending: Surgery | Admitting: Surgery

## 2019-03-20 ENCOUNTER — Other Ambulatory Visit: Payer: Self-pay

## 2019-03-20 ENCOUNTER — Other Ambulatory Visit: Payer: Self-pay | Admitting: Surgery

## 2019-03-20 DIAGNOSIS — N6022 Fibroadenosis of left breast: Secondary | ICD-10-CM

## 2019-03-20 DIAGNOSIS — R300 Dysuria: Secondary | ICD-10-CM

## 2019-03-20 MED ORDER — NITROFURANTOIN MONOHYD MACRO 100 MG PO CAPS: 100.0000 mg | ORAL_CAPSULE | Freq: Two times a day (BID) | ORAL | 0 refills | Status: DC

## 2019-03-20 NOTE — Patient Instructions (Signed)
-  I sent the medication(s) we discussed to your pharmacy: °Meds ordered this encounter  °Medications  °• nitrofurantoin, macrocrystal-monohydrate, (MACROBID) 100 MG capsule  °  Sig: Take 1 capsule (100 mg total) by mouth 2 (two) times daily.  °  Dispense:  14 capsule  °  Refill:  0  ° ° °Please let us know if you have any questions or concerns regarding this prescription. ° °I hope you are feeling better soon! °Seek care promptly if your symptoms worsen, new concerns arise or you are not improving with treatment. ° °

## 2019-03-20 NOTE — H&P (Signed)
Diamond Hansen  Location: [facility] Patient #: [id number] DOB: 1976/01/04 Single / Language: Cleophus Molt / Race: Black or African American Female   History of Present Illness ( The patient is a 43 year old female who presents with a breast mass. This is a pleasant 43 year old female referred by Dr. Micheline Rough for evaluation of a complex sclerosing lesion and abnormality of the left breast. On screening mammography, she had several abnormal areas at the 8 and 9 o'clock position of the left breast. 2 areas were biopsied. Both showed fibrocystic changes and one also showed a complex sclerosing lesion. There may be another couple sclerosing lesion adjacent to this. She has no previous problems with her breast. She denies nipple discharge. There is no family history of breast cancer. She is otherwise healthy and without complaints.   Past Surgical History No pertinent past surgical history   Diagnostic Studies History Colonoscopy  never Mammogram  within last year Pap Smear  1-5 years ago  Allergies (April Staton, CMA; 02/20/2019 10:48 AM) No Known Drug Allergies  [02/20/2019]:  Medication History (April Staton, CMA; 02/20/2019 10:48 AM) Sertraline HCl (50MG  Tablet, Oral) Active. Medications Reconciled  Social History (April Staton, CMA; 02/20/2019 10:46 AM) Alcohol use  Occasional alcohol use. Caffeine use  Coffee. No drug use  Tobacco use  Former smoker.  Family History (April Staton, [place]; 02/20/2019 10:46 AM) Diabetes Mellitus  Brother. Hypertension  Mother.  Pregnancy / Birth History (April Staton, [place]; 02/20/2019 10:46 AM) Age at menarche  69 years. Gravida  3 Maternal age  10-20 Para  3 Regular periods   Other Problems (April Staton, [place]; [id number] AM) Umbilical Hernia Repair     Review of Systems (April Staton CMA; 02/20/2019 10:46 AM) General Not Present- Appetite Loss, Chills, Fatigue, Fever, Night Sweats, Weight Gain and  Weight Loss. Skin Not Present- Change in Wart/Mole, Dryness, Hives, Jaundice, New Lesions, Non-Healing Wounds, Rash and Ulcer. HEENT Not Present- Earache, Hearing Loss, Hoarseness, Nose Bleed, Oral Ulcers, Ringing in the Ears, Seasonal Allergies, Sinus Pain, Sore Throat, Visual Disturbances, Wears glasses/contact lenses and Yellow Eyes. Breast Present- Breast Mass. Not Present- Breast Pain, Nipple Discharge and Skin Changes. Cardiovascular Not Present- Chest Pain, Difficulty Breathing Lying Down, Leg Cramps, Palpitations, Rapid Heart Rate, Shortness of Breath and Swelling of Extremities. Gastrointestinal Not Present- Abdominal Pain, Bloating, Bloody Stool, Change in Bowel Habits, Chronic diarrhea, Constipation, Difficulty Swallowing, Excessive gas, Gets full quickly at meals, Hemorrhoids, Indigestion, Nausea, Rectal Pain and Vomiting. Female Genitourinary Not Present- Frequency, Nocturia, Painful Urination, Pelvic Pain and Urgency. Musculoskeletal Not Present- Back Pain, Joint Pain, Joint Stiffness, Muscle Pain, Muscle Weakness and Swelling of Extremities. Neurological Not Present- Decreased Memory, Fainting, Headaches, Numbness, Seizures, Tingling, Tremor, Trouble walking and Weakness. Psychiatric Present- Anxiety. Not Present- Bipolar, Change in Sleep Pattern, Depression, Fearful and Frequent crying. Endocrine Not Present- Cold Intolerance, Excessive Hunger, Hair Changes, Heat Intolerance, Hot flashes and New Diabetes. Hematology Not Present- Blood Thinners, Easy Bruising, Excessive bleeding, Gland problems, HIV and Persistent Infections.  Vitals  Weight: 181.13 lb Height: 67in Body Surface Area: 1.94 m Body Mass Index: 28.37 kg/m  Temp.: 98.28F (Oral)  Pulse: 96 (Regular)  P.OX: 98% (Room air) BP: 120/78(Sitting, Right Arm, Standard)       Physical Exam General Mental Status-Alert. General Appearance-Consistent with stated age. Hydration-Well  hydrated. Voice-Normal.  Head and Neck Head-normocephalic, atraumatic with no lesions or palpable masses. Trachea-midline. Thyroid Gland Characteristics - normal size and consistency.  Eye Eyeball - Bilateral-Extraocular  movements intact. Sclera/Conjunctiva - Bilateral-No scleral icterus.  Chest and Lung Exam Chest and lung exam reveals -quiet, even and easy respiratory effort with no use of accessory muscles and on auscultation, normal breath sounds, no adventitious sounds and normal vocal resonance. Inspection Chest Wall - Normal. Back - normal.  Breast Breast - Left-Symmetric, Non Tender, No Biopsy scars, no Dimpling - Left, No Inflammation, No Lumpectomy scars, No Mastectomy scars, No Peau d' Orange. Breast - Right-Symmetric, Non Tender, No Biopsy scars, no Dimpling - Right, No Inflammation, No Lumpectomy scars, No Mastectomy scars, No Peau d' Orange. Breast Lump-No Palpable Breast Mass.  Cardiovascular Cardiovascular examination reveals -normal heart sounds, regular rate and rhythm with no murmurs and normal pedal pulses bilaterally.  Abdomen - Did not examine.  Neurologic - Did not examine.  Musculoskeletal Normal Exam - Left-Upper Extremity Strength Normal and Lower Extremity Strength Normal. Normal Exam - Right-Upper Extremity Strength Normal and Lower Extremity Strength Normal.  Lymphatic Head & Neck  General Head & Neck Lymphatics: Bilateral - Description - Normal. Axillary  General Axillary Region: Bilateral - Description - Normal. Tenderness - Non Tender. Femoral & Inguinal - Did not examine.    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 I gave her a copy of the path report. We discussed her left breast complex sclerosing lesion and no other abnormalities of the left breast. We discussed the reasons for excising this area  for complete histologic evaluation for malignancy. We discussed proceeding with a radioactive seed guided left breast lumpectomy. This may require 2 seeds. I discussed the surgical procedure in detail. I discussed the risks which includes his not limited to bleeding, infection, injury to surrounding structures, the need for further surgery if malignancy is found, cardiopulmonary issues, DVT, postoperative recovery, etc. She understands and wishes to proceed with surgery

## 2019-03-20 NOTE — Progress Notes (Signed)
Virtual Visit via Video Note  I connected with Diamond Hansen  on 03/20/19 at  4:20 PM EDT by a video enabled telemedicine application and verified that I am speaking with the correct person using two identifiers.  Location patient: home Location provider:work or home office Persons participating in the virtual visit: patient, provider  I discussed the limitations of evaluation and management by telemedicine and the availability of in person appointments. The patient expressed understanding and agreed to proceed.   HPI:  Acute visit for dysuria: -started about 1 week ago -symptoms include burning with urination, frequency, urgency, low back pain -no fevers, vomiting, vag discharge, flank pain, nausea, new sexual partners or vaginal odor -she has a history of BV and Chlamydia, but does not feel has had any interactions since cure of the chlamydia and prefer to try treatment for possible UT  ROS: See pertinent positives and negatives per HPI.  Past Medical History:  Diagnosis Date  . Anemia   . Anxiety   . GERD (gastroesophageal reflux disease)     Past Surgical History:  Procedure Laterality Date  . TUBAL LIGATION    . UMBILICAL HERNIA REPAIR     age 40    Family History  Problem Relation Age of Onset  . Hypertension Father   . Cancer Father        unknown type  . Diabetes Brother   . High blood pressure Mother   . Diabetes Maternal Grandmother   . High blood pressure Maternal Grandmother   . Diabetes Maternal Grandfather   . High blood pressure Maternal Grandfather   . Diabetes Paternal Grandmother   . High blood pressure Paternal Grandmother   . Diabetes Paternal Grandfather   . High blood pressure Paternal Grandfather   . Multiple sclerosis Paternal Uncle     SOCIAL HX: see hpi   Current Outpatient Medications:  .  Ferrous Sulfate (IRON) 325 (65 Fe) MG TABS, Take 325 mg by mouth daily. , Disp: , Rfl:  .  sertraline (ZOLOFT) 50 MG tablet, TAKE 1 TABLET BY MOUTH EVERY  DAY (Patient taking differently: Take 50 mg by mouth daily. ), Disp: 30 tablet, Rfl: 3 .  traZODone (DESYREL) 50 MG tablet, Take 50 mg by mouth at bedtime as needed for sleep., Disp: , Rfl:  .  nitrofurantoin, macrocrystal-monohydrate, (MACROBID) 100 MG capsule, Take 1 capsule (100 mg total) by mouth 2 (two) times daily., Disp: 14 capsule, Rfl: 0  EXAM:  VITALS per patient if applicable: denies fever  GENERAL: alert, oriented, appears well and in no acute distress  HEENT: atraumatic, conjunttiva clear, no obvious abnormalities on inspection of external nose and ears  NECK: normal movements of the head and neck  LUNGS: on inspection no signs of respiratory distress, breathing rate appears normal, no obvious gross SOB, gasping or wheezing  CV: no obvious cyanosis  MS: moves all visible extremities without noticeable abnormality  PSYCH/NEURO: pleasant and cooperative, no obvious depression or anxiety, speech and thought processing grossly intact  ASSESSMENT AND PLAN:  Discussed the following assessment and plan:  Dysuria  -we discussed possible serious and likely etiologies, options for evaluation and workup, limitations of telemedicine visit vs in person visit, treatment, treatment risks and precautions. Pt prefers to treat via telemedicine empirically rather then risking or undertaking an in person visit at this moment. She prefers to try empiric treatment for possible UTI with macrobid 100mg  bid for 7 days. She agrees to seek prompt in person care if worsening, new symptoms  arise, or if she is not improving with treatment.   I discussed the assessment and treatment plan with the patient. The patient was provided an opportunity to ask questions and all were answered. The patient agreed with the plan and demonstrated an understanding of the instructions.   The patient was advised to call back or seek an in-person evaluation if the symptoms worsen or if the condition fails to improve as  anticipated.   Diamond Kern, DO   Patient Instructions   -I sent the medication(s) we discussed to your pharmacy:  Meds ordered this encounter  Medications  . nitrofurantoin, macrocrystal-monohydrate, (MACROBID) 100 MG capsule    Sig: Take 1 capsule (100 mg total) by mouth 2 (two) times daily.    Dispense:  14 capsule    Refill:  0    Please let us know if you have any questions or concerns regarding this prescription.  I hope you are feeling better soon! Seek care promptly if your symptoms worsen, new concerns arise or you are not improving with treatment.

## 2019-03-21 ENCOUNTER — Encounter (HOSPITAL_COMMUNITY)
Admission: RE | Disposition: A | Discharge status: Home / Self Care | Payer: Self-pay | Source: Ambulatory Visit | Attending: Surgery

## 2019-03-21 ENCOUNTER — Ambulatory Visit (HOSPITAL_COMMUNITY): Payer: BC Managed Care – PPO | Admitting: Anesthesiology

## 2019-03-21 ENCOUNTER — Ambulatory Visit
Admission: RE | Admit: 2019-03-21 | Discharge: 2019-03-21 | Disposition: A | Discharge status: Home / Self Care | Payer: BC Managed Care – PPO | Source: Ambulatory Visit | Attending: Surgery | Admitting: Surgery

## 2019-03-21 ENCOUNTER — Other Ambulatory Visit: Payer: Self-pay

## 2019-03-21 ENCOUNTER — Ambulatory Visit (HOSPITAL_COMMUNITY)
Admission: RE | Admit: 2019-03-21 | Discharge: 2019-03-21 | Disposition: A | Discharge status: Home / Self Care | Payer: BC Managed Care – PPO | Source: Ambulatory Visit | Attending: Surgery | Admitting: Surgery

## 2019-03-21 ENCOUNTER — Encounter (HOSPITAL_COMMUNITY): Payer: Self-pay

## 2019-03-21 DIAGNOSIS — F419 Anxiety disorder, unspecified: Secondary | ICD-10-CM | POA: Insufficient documentation

## 2019-03-21 DIAGNOSIS — N6489 Other specified disorders of breast: Secondary | ICD-10-CM | POA: Insufficient documentation

## 2019-03-21 DIAGNOSIS — Z79899 Other long term (current) drug therapy: Secondary | ICD-10-CM | POA: Diagnosis not present

## 2019-03-21 DIAGNOSIS — D242 Benign neoplasm of left breast: Secondary | ICD-10-CM | POA: Insufficient documentation

## 2019-03-21 DIAGNOSIS — N6022 Fibroadenosis of left breast: Secondary | ICD-10-CM | POA: Insufficient documentation

## 2019-03-21 DIAGNOSIS — Z87891 Personal history of nicotine dependence: Secondary | ICD-10-CM | POA: Diagnosis not present

## 2019-03-21 HISTORY — PX: BREAST LUMPECTOMY WITH RADIOACTIVE SEED LOCALIZATION: SHX6424

## 2019-03-21 LAB — POCT PREGNANCY, URINE: Preg Test, Ur: NEGATIVE

## 2019-03-21 SURGERY — BREAST LUMPECTOMY WITH RADIOACTIVE SEED LOCALIZATION
Anesthesia: General | Site: Breast | Laterality: Left

## 2019-03-21 MED ORDER — CEFAZOLIN SODIUM-DEXTROSE 2-4 GM/100ML-% IV SOLN
2.0000 g | INTRAVENOUS | Status: AC
  Administered 2019-03-21: 13:00:00 2 g via INTRAVENOUS

## 2019-03-21 MED ORDER — 0.9 % SODIUM CHLORIDE (POUR BTL) OPTIME
TOPICAL | Status: DC | PRN
  Administered 2019-03-21: 1000 mL

## 2019-03-21 MED ORDER — SODIUM CHLORIDE 0.9 % IV SOLN
INTRAVENOUS | Status: DC | PRN
  Administered 2019-03-21: 14:00:00 40 ug/min via INTRAVENOUS

## 2019-03-21 MED ORDER — PHENYLEPHRINE HCL (PRESSORS) 10 MG/ML IV SOLN
INTRAVENOUS | Status: DC | PRN
  Administered 2019-03-21 (×3): 40 ug via INTRAVENOUS

## 2019-03-21 MED ORDER — CHLORHEXIDINE GLUCONATE CLOTH 2 % EX PADS: 6.0000 | MEDICATED_PAD | Freq: Once | CUTANEOUS | Status: DC

## 2019-03-21 MED ORDER — LACTATED RINGERS IV SOLN: INTRAVENOUS | Status: DC

## 2019-03-21 MED ORDER — ONDANSETRON HCL 4 MG/2ML IJ SOLN
INTRAMUSCULAR | Status: DC | PRN
  Administered 2019-03-21: 4 mg via INTRAVENOUS

## 2019-03-21 MED ORDER — PROPOFOL 10 MG/ML IV BOLUS
INTRAVENOUS | Status: AC
  Filled 2019-03-21: qty 20

## 2019-03-21 MED ORDER — MIDAZOLAM HCL 5 MG/5ML IJ SOLN
INTRAMUSCULAR | Status: DC | PRN
  Administered 2019-03-21: 2 mg via INTRAVENOUS

## 2019-03-21 MED ORDER — BUPIVACAINE-EPINEPHRINE 0.25% -1:200000 IJ SOLN
INTRAMUSCULAR | Status: DC | PRN
  Administered 2019-03-21: 17 mL

## 2019-03-21 MED ORDER — FENTANYL CITRATE (PF) 100 MCG/2ML IJ SOLN
INTRAMUSCULAR | Status: DC | PRN
  Administered 2019-03-21: 100 ug via INTRAVENOUS

## 2019-03-21 MED ORDER — FENTANYL CITRATE (PF) 250 MCG/5ML IJ SOLN
INTRAMUSCULAR | Status: AC
  Filled 2019-03-21: qty 5

## 2019-03-21 MED ORDER — CELECOXIB 200 MG PO CAPS
200.0000 mg | ORAL_CAPSULE | ORAL | Status: AC
  Administered 2019-03-21: 11:00:00 200 mg via ORAL

## 2019-03-21 MED ORDER — GLYCOPYRROLATE PF 0.2 MG/ML IJ SOSY
PREFILLED_SYRINGE | INTRAMUSCULAR | Status: DC | PRN
  Administered 2019-03-21: .2 mg via INTRAVENOUS

## 2019-03-21 MED ORDER — GABAPENTIN 300 MG PO CAPS
ORAL_CAPSULE | ORAL | Status: AC
  Administered 2019-03-21: 11:00:00 300 mg via ORAL
  Filled 2019-03-21: qty 1

## 2019-03-21 MED ORDER — MIDAZOLAM HCL 2 MG/2ML IJ SOLN
INTRAMUSCULAR | Status: AC
  Filled 2019-03-21: qty 2

## 2019-03-21 MED ORDER — LACTATED RINGERS IV SOLN
INTRAVENOUS | Status: DC
  Administered 2019-03-21 (×2): via INTRAVENOUS

## 2019-03-21 MED ORDER — BUPIVACAINE-EPINEPHRINE 0.25% -1:200000 IJ SOLN
INTRAMUSCULAR | Status: AC
  Filled 2019-03-21: qty 1

## 2019-03-21 MED ORDER — CELECOXIB 200 MG PO CAPS
ORAL_CAPSULE | ORAL | Status: AC
  Administered 2019-03-21: 200 mg via ORAL
  Filled 2019-03-21: qty 1

## 2019-03-21 MED ORDER — DEXAMETHASONE SODIUM PHOSPHATE 4 MG/ML IJ SOLN
INTRAMUSCULAR | Status: DC | PRN
  Administered 2019-03-21: 5 mg via INTRAVENOUS

## 2019-03-21 MED ORDER — LIDOCAINE 2% (20 MG/ML) 5 ML SYRINGE
INTRAMUSCULAR | Status: DC | PRN
  Administered 2019-03-21: 60 mg via INTRAVENOUS

## 2019-03-21 MED ORDER — ACETAMINOPHEN 500 MG PO TABS
1000.0000 mg | ORAL_TABLET | ORAL | Status: AC
  Administered 2019-03-21: 11:00:00 1000 mg via ORAL

## 2019-03-21 MED ORDER — GABAPENTIN 300 MG PO CAPS
300.0000 mg | ORAL_CAPSULE | ORAL | Status: AC
  Administered 2019-03-21: 11:00:00 300 mg via ORAL

## 2019-03-21 MED ORDER — OXYCODONE HCL 5 MG PO TABS: 5.0000 mg | ORAL_TABLET | Freq: Four times a day (QID) | ORAL | 0 refills | Status: DC | PRN

## 2019-03-21 MED ORDER — PROPOFOL 10 MG/ML IV BOLUS
INTRAVENOUS | Status: DC | PRN
  Administered 2019-03-21: 150 mg via INTRAVENOUS

## 2019-03-21 MED ORDER — ACETAMINOPHEN 500 MG PO TABS
ORAL_TABLET | ORAL | Status: AC
  Administered 2019-03-21: 1000 mg via ORAL
  Filled 2019-03-21: qty 2

## 2019-03-21 SURGICAL SUPPLY — 31 items
APPLIER CLIP 9.375 MED OPEN (MISCELLANEOUS) ×3
BINDER BREAST LRG (GAUZE/BANDAGES/DRESSINGS) IMPLANT
BINDER BREAST XLRG (GAUZE/BANDAGES/DRESSINGS) ×6 IMPLANT
CANISTER SUCT 3000ML PPV (MISCELLANEOUS) ×3 IMPLANT
CHLORAPREP W/TINT 26 (MISCELLANEOUS) ×3 IMPLANT
CLIP APPLIE 9.375 MED OPEN (MISCELLANEOUS) ×1 IMPLANT
COVER PROBE W GEL 5X96 (DRAPES) ×3 IMPLANT
COVER SURGICAL LIGHT HANDLE (MISCELLANEOUS) ×3 IMPLANT
COVER WAND RF STERILE (DRAPES) ×3 IMPLANT
DERMABOND ADVANCED (GAUZE/BANDAGES/DRESSINGS) ×2
DERMABOND ADVANCED .7 DNX12 (GAUZE/BANDAGES/DRESSINGS) ×1 IMPLANT
DEVICE DUBIN SPECIMEN MAMMOGRA (MISCELLANEOUS) ×3 IMPLANT
DRAPE CHEST BREAST 15X10 FENES (DRAPES) ×3 IMPLANT
ELECT CAUTERY BLADE 6.4 (BLADE) ×3 IMPLANT
ELECT REM PT RETURN 9FT ADLT (ELECTROSURGICAL) ×3
ELECTRODE REM PT RTRN 9FT ADLT (ELECTROSURGICAL) ×1 IMPLANT
GLOVE SURG SIGNA 7.5 PF LTX (GLOVE) ×3 IMPLANT
GOWN STRL REUS W/ TWL LRG LVL3 (GOWN DISPOSABLE) ×1 IMPLANT
GOWN STRL REUS W/ TWL XL LVL3 (GOWN DISPOSABLE) ×1 IMPLANT
GOWN STRL REUS W/TWL LRG LVL3 (GOWN DISPOSABLE) ×2
GOWN STRL REUS W/TWL XL LVL3 (GOWN DISPOSABLE) ×2
KIT BASIN OR (CUSTOM PROCEDURE TRAY) ×3 IMPLANT
KIT MARKER MARGIN INK (KITS) ×3 IMPLANT
NEEDLE HYPO 25GX1X1/2 BEV (NEEDLE) ×3 IMPLANT
NS IRRIG 1000ML POUR BTL (IV SOLUTION) IMPLANT
PACK GENERAL/GYN (CUSTOM PROCEDURE TRAY) ×3 IMPLANT
SUT MNCRL AB 4-0 PS2 18 (SUTURE) ×3 IMPLANT
SUT VIC AB 3-0 SH 18 (SUTURE) ×3 IMPLANT
SYR CONTROL 10ML LL (SYRINGE) ×3 IMPLANT
TOWEL GREEN STERILE (TOWEL DISPOSABLE) ×3 IMPLANT
TOWEL GREEN STERILE FF (TOWEL DISPOSABLE) ×3 IMPLANT

## 2019-03-21 NOTE — Anesthesia Preprocedure Evaluation (Addendum)
Anesthesia Evaluation  Patient identified by MRN, date of birth, ID band Patient awake    Reviewed: Allergy & Precautions, H&P , NPO status , Patient's Chart, lab work & pertinent test results  Airway Mallampati: II  TM Distance: >3 FB Neck ROM: Full    Dental no notable dental hx. (+) Teeth Intact, Dental Advisory Given   Pulmonary neg pulmonary ROS, former smoker,    Pulmonary exam normal breath sounds clear to auscultation       Cardiovascular negative cardio ROS   Rhythm:Regular Rate:Normal     Neuro/Psych Anxiety negative neurological ROS     GI/Hepatic Neg liver ROS, GERD  Controlled,  Endo/Other  negative endocrine ROS  Renal/GU negative Renal ROS  negative genitourinary   Musculoskeletal   Abdominal   Peds  Hematology  (+) Blood dyscrasia, anemia ,   Anesthesia Other Findings   Reproductive/Obstetrics negative OB ROS                            Anesthesia Physical Anesthesia Plan  ASA: II  Anesthesia Plan: General   Post-op Pain Management:    Induction: Intravenous  PONV Risk Score and Plan: 4 or greater and Ondansetron, Dexamethasone and Midazolam  Airway Management Planned: LMA  Additional Equipment:   Intra-op Plan:   Post-operative Plan: Extubation in OR  Informed Consent: I have reviewed the patients History and Physical, chart, labs and discussed the procedure including the risks, benefits and alternatives for the proposed anesthesia with the patient or authorized representative who has indicated his/her understanding and acceptance.     Dental advisory given  Plan Discussed with: CRNA  Anesthesia Plan Comments:         Anesthesia Quick Evaluation

## 2019-03-21 NOTE — Interval H&P Note (Signed)
History and Physical Interval Note:no change in H and P  03/21/2019 12:56 PM  Diamond Hansen  has presented today for surgery, with the diagnosis of LEFT BREAST [name].  The various methods of treatment have been discussed with the patient and family. After consideration of risks, benefits and other options for treatment, the patient has consented to  Procedure(s): LEFT BREAST LUMPECTOMY WITH BRACKETED RADIOACTIVE SEED LOCALIZATION (Left) as a surgical intervention.  The patient's history has been reviewed, patient examined, no change in status, stable for surgery.  I have reviewed the patient's chart and labs.  Questions were answered to the patient's satisfaction.     Coralie Keens

## 2019-03-21 NOTE — Discharge Instructions (Signed)
[place] Office Phone Number [phone]  BREAST BIOPSY/ PARTIAL MASTECTOMY: POST OP INSTRUCTIONS  Always review your discharge instruction sheet given to you by the facility where your surgery was performed.  IF YOU HAVE DISABILITY OR FAMILY LEAVE FORMS, YOU MUST BRING THEM TO THE OFFICE FOR PROCESSING.  DO NOT GIVE THEM TO YOUR DOCTOR.  1. A prescription for pain medication may be given to you upon discharge.  Take your pain medication as prescribed, if needed.  If narcotic pain medicine is not needed, then you may take acetaminophen (Tylenol) or ibuprofen (Advil) as needed. 2. Take your usually prescribed medications unless otherwise directed 3. If you need a refill on your pain medication, please contact your pharmacy.  They will contact our office to request authorization.  Prescriptions will not be filled after 5pm or on week-ends. 4. You should eat very light the first 24 hours after surgery, such as soup, crackers, pudding, etc.  Resume your normal diet the day after surgery. 5. Most patients will experience some swelling and bruising in the breast.  Ice packs and a good support bra will help.  Swelling and bruising can take several days to resolve.  6. It is common to experience some constipation if taking pain medication after surgery.  Increasing fluid intake and taking a stool softener will usually help or prevent this problem from occurring.  A mild laxative (Milk of Magnesia or Miralax) should be taken according to package directions if there are no bowel movements after 48 hours. 7. Unless discharge instructions indicate otherwise, you may remove your bandages 24-48 hours after surgery, and you may shower at that time.  You may have steri-strips (small skin tapes) in place directly over the incision.  These strips should be left on the skin for 7-10 days.  If your surgeon used skin glue on the incision, you may shower in 24 hours.  The glue will flake off over the  next 2-3 weeks.  Any sutures or staples will be removed at the office during your follow-up visit. 8. ACTIVITIES:  You may resume regular daily activities (gradually increasing) beginning the next day.  Wearing a good support bra or sports bra minimizes pain and swelling.  You may have sexual intercourse when it is comfortable. a. You may drive when you no longer are taking prescription pain medication, you can comfortably wear a seatbelt, and you can safely maneuver your car and apply brakes. b. RETURN TO WORK:  ______________________________________________________________________________________ 9. You should see your doctor in the office for a follow-up appointment approximately two weeks after your surgery.  Your doctors nurse will typically make your follow-up appointment when she calls you with your pathology report.  Expect your pathology report 2-3 business days after your surgery.  You may call to check if you do not hear from [place] after three days. 10. OTHER INSTRUCTIONS: __ok to shower starting tomorrow 11. Ice pack, tylenol, ibuprofen also for pain 12. No vigorous activity for 1 week 13. _____________________________________________________________________________________________ _____________________________________________________________________________________________________________________________________ _____________________________________________________________________________________________________________________________________ _____________________________________________________________________________________________________________________________________  WHEN TO CALL YOUR DOCTOR: 1. Fever over 101.0 2. Nausea and/or vomiting. 3. Extreme swelling or bruising. 4. Continued bleeding from incision. 5. Increased pain, redness, or drainage from the incision.  The clinic staff is available to answer your questions during regular business hours.  Please dont hesitate to  call and ask to speak to one of the nurses for clinical concerns.  If you have a medical emergency, go to the nearest emergency room or call 911.  A surgeon from [facility] is always on call at the hospital.  For further questions, please visit [email or web address]

## 2019-03-21 NOTE — Op Note (Signed)
LEFT BREAST LUMPECTOMY WITH BRACKETED RADIOACTIVE SEED LOCALIZATION  Procedure Note  Diamond Hansen 03/21/2019   Pre-op Diagnosis: LEFT BREAST COMPLEX SCLEROSING LESIONS     Post-op Diagnosis: SAME  Procedure(s): LEFT BREAST LUMPECTOMY WITH BRACKETED RADIOACTIVE SEED LOCALIZATION  Surgeon(s): Coralie Keens, MD  Anesthesia: General  Staff:  Circulator: Hal Morales, RN Scrub Person: Rolan Bucco Float Surgical Tech: Dollene Cleveland T  Estimated Blood Loss: Minimal               Specimens: SENT TO PATH  Procedure: The patient was brought to the operating room and identifies the correct patient.  She is placed on the operating room table and general anesthesia was induced.  Her left breast was prepped and draped in usual sterile fashion.  Identified the radioactive seeds medial aspect of the left breast underneath the areola.  I anesthetized the skin around the areola with Marcaine and made a circumareolar incision with a scalpel.  I did then dissected down to the breast tissue with the cautery.  Identified a hematoma which was and what appeared to be a biopsy cavity superficially and then a deeper mass more superior to this area.  I excised these 2 areas and 1 total piece.  The radioactive seed is appear to be in the specimen.  I marked all margins with marker paint.  An x-ray was performed confirming that the 2 radioactive seeds in the more suspicious marker were in the specimen.  I can still feel a slight hardened area and took some more breast tissue which I sent as superior and deep margin.  I could palpate no other abnormalities and cannot identify the other marker.  Achieved hemostasis with cautery.  I anesthetized the wounds further with Marcaine.  I then closed the subcutaneous tissue with interrupted 3-0 Vicryl sutures and closed the skin with a running 4-0 Monocryl.  Dermabond was then applied.  The patient tolerated procedure well.  All the counts were correct at the end of  the procedure.  The patient was then extubated in the operating room and taken in stable addition to the recovery room.          Coralie Keens   Date: 03/21/2019  Time: 1:59 PM

## 2019-03-21 NOTE — Transfer of Care (Signed)
Immediate Anesthesia Transfer of Care Note  Patient: Diamond Hansen  Procedure(s) Performed: LEFT BREAST LUMPECTOMY WITH BRACKETED RADIOACTIVE SEED LOCALIZATION (Left Breast)  Patient Location: PACU  Anesthesia Type:General  Level of Consciousness: awake, alert  and oriented  Airway & Oxygen Therapy: Patient Spontanous Breathing  Post-op Assessment: Report given to RN, Post -op Vital signs reviewed and stable and Patient moving all extremities  Post vital signs: Reviewed and stable  Last Vitals:  Vitals Value Taken Time  BP    Temp    Pulse    Resp    SpO2      Last Pain:  Vitals:   03/21/19 1051  TempSrc:   PainSc: 0-No pain         Complications: No apparent anesthesia complications

## 2019-03-21 NOTE — Anesthesia Postprocedure Evaluation (Signed)
Anesthesia Post Note  Patient: Diamond Hansen  Procedure(s) Performed: LEFT BREAST LUMPECTOMY WITH BRACKETED RADIOACTIVE SEED LOCALIZATION (Left Breast)     Patient location during evaluation: PACU Anesthesia Type: General Level of consciousness: awake and alert Pain management: pain level controlled Vital Signs Assessment: post-procedure vital signs reviewed and stable Respiratory status: spontaneous breathing, nonlabored ventilation and respiratory function stable Cardiovascular status: blood pressure returned to baseline and stable Postop Assessment: no apparent nausea or vomiting Anesthetic complications: no    Last Vitals:  Vitals:   03/21/19 1410 03/21/19 1425  BP: 116/68 126/80  Pulse: (!) 101 91  Resp: 16 15  Temp: 36.7 C   SpO2: 100% 100%    Last Pain:  Vitals:   03/21/19 1410  TempSrc:   PainSc: 0-No pain                 Kenneisha Cochrane,W. EDMOND

## 2019-03-21 NOTE — Anesthesia Procedure Notes (Signed)
Procedure Name: LMA Insertion Date/Time: 03/21/2019 1:25 PM Performed by: Amadeo Garnet, CRNA Pre-anesthesia Checklist: Patient identified, Emergency Drugs available, Suction available, Patient being monitored and Timeout performed Patient Re-evaluated:Patient Re-evaluated prior to induction Oxygen Delivery Method: Circle system utilized Preoxygenation: Pre-oxygenation with 100% oxygen Induction Type: IV induction LMA: LMA inserted LMA Size: 4.0 Number of attempts: 1 Placement Confirmation: ETT inserted through vocal cords under direct vision,  positive ETCO2 and breath sounds checked- equal and bilateral Dental Injury: Teeth and Oropharynx as per pre-operative assessment

## 2019-03-22 ENCOUNTER — Encounter (HOSPITAL_COMMUNITY): Payer: Self-pay | Admitting: Surgery

## 2019-03-23 LAB — SURGICAL PATHOLOGY

## 2019-03-28 ENCOUNTER — Telehealth: Payer: Self-pay | Admitting: *Deleted

## 2019-03-28 NOTE — Telephone Encounter (Signed)
Yes ok to put her in on friday

## 2019-03-28 NOTE — Telephone Encounter (Signed)
I called the pt and scheduled an appt for Friday at 8:30am.

## 2019-03-28 NOTE — Telephone Encounter (Signed)
Copied from [place] [id number]. Topic: Appointment Scheduling - Scheduling Inquiry for Clinic >> Mar 28, 2019 10:06 AM Scherrie Gerlach wrote: Reason for CRM: pt saw Dr Maudie Mercury virtually and got something for UTI.  But does not seem to be helping. Dr Maudie Mercury advised if she was not better to come in see dr K, and she is not.  Requesting appt Friday am, but schedule is blocked. Is it OK? Please call pt.

## 2019-03-30 ENCOUNTER — Other Ambulatory Visit: Payer: Self-pay

## 2019-03-30 ENCOUNTER — Other Ambulatory Visit (HOSPITAL_COMMUNITY)
Admission: RE | Admit: 2019-03-30 | Discharge: 2019-03-30 | Disposition: A | Discharge status: Home / Self Care | Payer: BC Managed Care – PPO | Source: Ambulatory Visit | Attending: Family Medicine | Admitting: Family Medicine

## 2019-03-30 ENCOUNTER — Ambulatory Visit (INDEPENDENT_AMBULATORY_CARE_PROVIDER_SITE_OTHER): Payer: BC Managed Care – PPO | Admitting: Family Medicine

## 2019-03-30 ENCOUNTER — Encounter: Payer: Self-pay | Admitting: Family Medicine

## 2019-03-30 VITALS — BP 100/80 | HR 82 | Temp 98.2°F | Ht 67.0 in | Wt 184.5 lb

## 2019-03-30 DIAGNOSIS — N949 Unspecified condition associated with female genital organs and menstrual cycle: Secondary | ICD-10-CM

## 2019-03-30 DIAGNOSIS — N898 Other specified noninflammatory disorders of vagina: Secondary | ICD-10-CM | POA: Diagnosis not present

## 2019-03-30 DIAGNOSIS — R300 Dysuria: Secondary | ICD-10-CM | POA: Diagnosis present

## 2019-03-30 LAB — POC URINALSYSI DIPSTICK (AUTOMATED)
Bilirubin, UA: NEGATIVE
Glucose, UA: NEGATIVE
Ketones, UA: NEGATIVE
Leukocytes, UA: NEGATIVE
Nitrite, UA: NEGATIVE
Protein, UA: NEGATIVE
Spec Grav, UA: >=1.03 — AB (ref 1.010–1.025)
Urobilinogen, UA: 0.2 E.U./dL
pH, UA: 5.5 (ref 5.0–8.0)

## 2019-03-30 MED ORDER — METRONIDAZOLE 500 MG PO TABS: 500.0000 mg | ORAL_TABLET | Freq: Two times a day (BID) | ORAL | 0 refills | Status: DC

## 2019-03-30 MED ORDER — FLUCONAZOLE 150 MG PO TABS: 150.0000 mg | ORAL_TABLET | Freq: Once | ORAL | 0 refills | Status: AC

## 2019-03-30 NOTE — Progress Notes (Signed)
Diamond Hansen DOB: 1975-11-15 Encounter date: 03/30/2019  This is a 43 y.o. female who presents with Chief Complaint  Patient presents with  . Dysuria    patient complains of burning sensation with and without urination and low back pain x2 weeks, denies any vaginal discharge or other symptoms    History of present illness: Still having symptoms even after taking macrobid.  Lower back is hurting - seemed a little better after antibiotics, but burning sensation is still there. It burns with and without urination. At first felt like sharp pain with urination, but then that improved, but burning stayed.   No rash. Started period last weds. When she wiped there was some blood with wiping and in urine.   No vaginal discharge that is significant. Doesn't usually have burning with BV, usually has more odor. Does get lower back pain with BV.   Does have new sexual partner in last few months.    No Known Allergies Current Meds  Medication Sig  . Ferrous Sulfate (IRON) 325 (65 Fe) MG TABS Take 325 mg by mouth daily.   Marland Kitchen oxyCODONE (OXY IR/ROXICODONE) 5 MG immediate release tablet Take 1 tablet (5 mg total) by mouth every 6 (six) hours as needed for moderate pain or severe pain.  Marland Kitchen sertraline (ZOLOFT) 50 MG tablet TAKE 1 TABLET BY MOUTH EVERY DAY (Patient taking differently: Take 50 mg by mouth daily. )  . traZODone (DESYREL) 50 MG tablet Take 50 mg by mouth at bedtime as needed for sleep.    Review of Systems  Constitutional: Negative for chills, fatigue and fever.  Respiratory: Negative for cough, chest tightness, shortness of breath and wheezing.   Cardiovascular: Negative for chest pain, palpitations and leg swelling.  Gastrointestinal: Negative for abdominal pain.  Genitourinary: Negative for difficulty urinating, dyspareunia, frequency, hematuria and vaginal discharge. Dysuria: sometimes.  Musculoskeletal: Positive for back pain.    Objective:  BP 100/80 (BP Location: Left Arm,  Patient Position: Sitting, Cuff Size: Large)   Pulse 82   Temp 98.2 F (36.8 C) (Temporal)   Ht 5\' 7"  (1.702 m)   Wt 184 lb 8 oz (83.7 kg)   LMP 03/21/2019 (Exact Date)   SpO2 99%   BMI 28.90 kg/m   Weight: 184 lb 8 oz (83.7 kg)   BP Readings from Last 3 Encounters:  03/30/19 100/80  03/21/19 129/87  03/14/19 122/72   Wt Readings from Last 3 Encounters:  03/30/19 184 lb 8 oz (83.7 kg)  03/21/19 179 lb 11.2 oz (81.5 kg)  03/14/19 179 lb 11.2 oz (81.5 kg)    Physical Exam Exam conducted with a chaperone present.  Constitutional:      General: She is not in acute distress.    Appearance: She is well-developed.  Cardiovascular:     Rate and Rhythm: Normal rate and regular rhythm.     Heart sounds: Normal heart sounds. No murmur. No friction rub.  Pulmonary:     Effort: Pulmonary effort is normal. No respiratory distress.     Breath sounds: Normal breath sounds. No wheezing or rales.  Abdominal:     General: Abdomen is flat. Bowel sounds are normal. There is no distension.     Palpations: Abdomen is soft.     Tenderness: There is no abdominal tenderness. There is no right CVA tenderness, left CVA tenderness, guarding or rebound.     Hernia: There is no hernia in the left inguinal area or right inguinal area.  Genitourinary:  Exam position: Supine.     Labia:        Right: No rash, tenderness or lesion.        Left: No rash, tenderness or lesion.      Urethra: No prolapse, urethral swelling or urethral lesion.     Vagina: Vaginal discharge (thin, white) present.     Cervix: Discharge present. No cervical motion tenderness or eversion.     Uterus: Normal.      Adnexa: Right adnexa normal and left adnexa normal.       Comments: There is erythema inferior vaginal introitus.  Musculoskeletal:     Right lower leg: No edema.     Left lower leg: No edema.  Lymphadenopathy:     Lower Body: No right inguinal adenopathy. No left inguinal adenopathy.  Neurological:      Mental Status: She is alert and oriented to person, place, and time.  Psychiatric:        Behavior: Behavior normal.     Assessment/Plan   1. Dysuria Burning is on skin and noted with urination. I wonder if coming from erythematous area on vaginal introitus. I have taken a swab for HSV from this area due to symptoms. STD panel also obtained today. No known exposure, but has had new sexual partner and did not use protection with intercourse.  - POCT Urinalysis Dipstick (Automated)  2. Vaginal burning See above. Frequent BV infections. Did print flagyl rx and diflucan due to discharge in case worsening over weekend. Instructed to call if new sx like abd pain.    Return if symptoms worsen or fail to improve.     Micheline Rough, MD

## 2019-03-30 NOTE — Patient Instructions (Signed)
Let me know if any worsening of symptoms. If you not more odor to discharge over weekend, thickened discharge, you can take the prescribed medications. If you have additional symptoms like abdominal pain or fever, you need to be seen or talk with provider.   I will call you as soon as your cultures come back.

## 2019-04-01 ENCOUNTER — Other Ambulatory Visit: Payer: Self-pay | Admitting: Family Medicine

## 2019-04-03 LAB — HERPES SIMPLEX VIRUS CULTURE W/RFLX TO TYPING
MICRO NUMBER:: 1029166
SPECIMEN QUALITY:: ADEQUATE

## 2019-04-03 LAB — URINE CULTURE
MICRO NUMBER:: 1026053
SPECIMEN QUALITY:: ADEQUATE

## 2019-04-05 LAB — CERVICOVAGINAL ANCILLARY ONLY
Bacterial Vaginitis (gardnerella): POSITIVE — AB
Candida Glabrata: NEGATIVE
Candida Vaginitis: NEGATIVE
Chlamydia: NEGATIVE
Comment: NEGATIVE
Comment: NEGATIVE
Comment: NEGATIVE
Comment: NEGATIVE
Comment: NEGATIVE
Comment: NORMAL
Neisseria Gonorrhea: NEGATIVE
Trichomonas: NEGATIVE

## 2019-04-06 MED ORDER — METRONIDAZOLE 500 MG PO TABS: 500.0000 mg | ORAL_TABLET | Freq: Two times a day (BID) | ORAL | 0 refills | Status: AC

## 2019-04-06 NOTE — Addendum Note (Signed)
Addended by: Agnes Lawrence on: 04/06/2019 10:26 AM   Modules accepted: Orders

## 2019-05-30 ENCOUNTER — Ambulatory Visit: Payer: Self-pay | Admitting: Family Medicine

## 2019-06-01 ENCOUNTER — Other Ambulatory Visit: Payer: Self-pay | Admitting: Family Medicine

## 2019-06-25 ENCOUNTER — Encounter: Payer: Self-pay | Admitting: Family Medicine

## 2019-06-25 ENCOUNTER — Ambulatory Visit (INDEPENDENT_AMBULATORY_CARE_PROVIDER_SITE_OTHER): Payer: BC Managed Care – PPO | Admitting: Family Medicine

## 2019-06-25 ENCOUNTER — Other Ambulatory Visit: Payer: Self-pay

## 2019-06-25 VITALS — BP 120/84 | HR 85 | Temp 97.5°F | Ht 67.0 in | Wt 187.2 lb

## 2019-06-25 DIAGNOSIS — R7301 Impaired fasting glucose: Secondary | ICD-10-CM

## 2019-06-25 DIAGNOSIS — K219 Gastro-esophageal reflux disease without esophagitis: Secondary | ICD-10-CM

## 2019-06-25 DIAGNOSIS — F419 Anxiety disorder, unspecified: Secondary | ICD-10-CM | POA: Diagnosis not present

## 2019-06-25 DIAGNOSIS — E611 Iron deficiency: Secondary | ICD-10-CM

## 2019-06-25 DIAGNOSIS — R635 Abnormal weight gain: Secondary | ICD-10-CM

## 2019-06-25 DIAGNOSIS — N760 Acute vaginitis: Secondary | ICD-10-CM

## 2019-06-25 DIAGNOSIS — B9689 Other specified bacterial agents as the cause of diseases classified elsewhere: Secondary | ICD-10-CM

## 2019-06-25 LAB — CBC WITH DIFFERENTIAL/PLATELET
Basophils Absolute: 0.1 10*3/uL (ref 0.0–0.1)
Basophils Relative: 1.2 % (ref 0.0–3.0)
Eosinophils Absolute: 0.1 10*3/uL (ref 0.0–0.7)
Eosinophils Relative: 2 % (ref 0.0–5.0)
HCT: 39.2 % (ref 36.0–46.0)
Hemoglobin: 12.3 g/dL (ref 12.0–15.0)
Lymphocytes Relative: 50.6 % — ABNORMAL HIGH (ref 12.0–46.0)
Lymphs Abs: 2.5 10*3/uL (ref 0.7–4.0)
MCHC: 31.5 g/dL (ref 30.0–36.0)
MCV: 79.3 fl (ref 78.0–100.0)
Monocytes Absolute: 0.4 10*3/uL (ref 0.1–1.0)
Monocytes Relative: 8.1 % (ref 3.0–12.0)
Neutro Abs: 1.9 10*3/uL (ref 1.4–7.7)
Neutrophils Relative %: 38.1 % — ABNORMAL LOW (ref 43.0–77.0)
Platelets: 390 10*3/uL (ref 150.0–400.0)
RBC: 4.94 Mil/uL (ref 3.87–5.11)
RDW: 16.7 % — ABNORMAL HIGH (ref 11.5–15.5)
WBC: 5 10*3/uL (ref 4.0–10.5)

## 2019-06-25 LAB — COMPREHENSIVE METABOLIC PANEL
ALT: 9 U/L (ref 0–35)
AST: 10 U/L (ref 0–37)
Albumin: 4.3 g/dL (ref 3.5–5.2)
Alkaline Phosphatase: 64 U/L (ref 39–117)
BUN: 9 mg/dL (ref 6–23)
CO2: 23 mEq/L (ref 19–32)
Calcium: 9.1 mg/dL (ref 8.4–10.5)
Chloride: 107 mEq/L (ref 96–112)
Creatinine, Ser: 0.73 mg/dL (ref 0.40–1.20)
GFR: 106.06 mL/min (ref >60.00)
Glucose, Bld: 91 mg/dL (ref 70–99)
Potassium: 3.9 mEq/L (ref 3.5–5.1)
Sodium: 140 mEq/L (ref 135–145)
Total Bilirubin: 0.2 mg/dL (ref 0.2–1.2)
Total Protein: 7.3 g/dL (ref 6.0–8.3)

## 2019-06-25 LAB — IBC + FERRITIN
Ferritin: 7.1 ng/mL — ABNORMAL LOW (ref 10.0–291.0)
Iron: 35 ug/dL — ABNORMAL LOW (ref 42–145)
Saturation Ratios: 7 % — ABNORMAL LOW (ref 20.0–50.0)
Transferrin: 359 mg/dL (ref 212.0–360.0)

## 2019-06-25 LAB — TSH: TSH: 0.35 u[IU]/mL (ref 0.35–4.50)

## 2019-06-25 LAB — HEMOGLOBIN A1C: Hgb A1c MFr Bld: 5.7 % (ref 4.6–6.5)

## 2019-06-25 MED ORDER — METRONIDAZOLE 500 MG PO TABS: 500.0000 mg | ORAL_TABLET | Freq: Two times a day (BID) | ORAL | 0 refills | Status: DC

## 2019-06-25 MED ORDER — METRONIDAZOLE 0.75 % VA GEL: 1.0000 | VAGINAL | 1 refills | Status: DC

## 2019-06-25 MED ORDER — SERTRALINE HCL 50 MG PO TABS: 50.0000 mg | ORAL_TABLET | Freq: Every day | ORAL | 1 refills | Status: DC

## 2019-06-25 NOTE — Progress Notes (Signed)
Diamond Hansen DOB: Oct 20, 1975 Encounter date: 06/25/2019  This is a 44 y.o. female who presents with Chief Complaint  Patient presents with  . Follow-up    6 month follow-up    History of present illness: Anxiety: Zoloft 50 mg daily; therapy. States that she is doing much better.  GERD: Anemia: Iron supplementation Insomnia:  Feels like she is craving salt, sugar. Frustrated with weight gain. Working third shift. Makes things more difficult. Craving a lot of carbohydrates.   Thinks she has BV again - has fishy odor to discharge. No sexual activity. Does try to mostly do showers.    No Known Allergies Current Meds  Medication Sig  . Ferrous Sulfate (IRON) 325 (65 Fe) MG TABS Take 325 mg by mouth daily.   . sertraline (ZOLOFT) 50 MG tablet Take 1 tablet (50 mg total) by mouth daily.  . traZODone (DESYREL) 50 MG tablet TAKE 1/2 TO 1 TABLET AT BEDTIME AS NEEDED FOR SLEEP  . [DISCONTINUED] sertraline (ZOLOFT) 50 MG tablet TAKE 1 TABLET BY MOUTH EVERY DAY    Review of Systems  Constitutional: Positive for fatigue. Negative for chills and fever.  Respiratory: Negative for cough, chest tightness, shortness of breath and wheezing.   Cardiovascular: Negative for chest pain, palpitations and leg swelling.  Gastrointestinal: Negative for abdominal pain, constipation and diarrhea.  Genitourinary:       Has been very thirsty. Usually a juice person and drinking ginger ale. Mostly water though    Objective:  BP 120/84 (BP Location: Right Arm, Patient Position: Sitting, Cuff Size: Normal)   Pulse 85   Temp (!) 97.5 F (36.4 C) (Temporal)   Ht 5\' 7"  (1.702 m)   Wt 187 lb 4 oz (84.9 kg)   LMP 05/25/2019   SpO2 99%   BMI 29.33 kg/m   Weight: 187 lb 4 oz (84.9 kg)   BP Readings from Last 3 Encounters:  06/25/19 120/84  03/30/19 100/80  03/21/19 129/87   Wt Readings from Last 3 Encounters:  06/25/19 187 lb 4 oz (84.9 kg)  03/30/19 184 lb 8 oz (83.7 kg)  03/21/19 179 lb 11.2 oz  (81.5 kg)    Physical Exam Constitutional:      General: She is not in acute distress.    Appearance: She is well-developed.  Neck:     Thyroid: No thyroid mass or thyromegaly.  Cardiovascular:     Rate and Rhythm: Normal rate and regular rhythm.     Heart sounds: Normal heart sounds. No murmur. No friction rub.  Pulmonary:     Effort: Pulmonary effort is normal. No respiratory distress.     Breath sounds: Normal breath sounds. No wheezing or rales.  Musculoskeletal:     Right lower leg: No edema.     Left lower leg: No edema.  Neurological:     Mental Status: She is alert and oriented to person, place, and time.  Psychiatric:        Behavior: Behavior normal.     Assessment/Plan  1. Anxiety Well controlled. Continue zoloft at current dose. - sertraline (ZOLOFT) 50 MG tablet; Take 1 tablet (50 mg total) by mouth daily.  Dispense: 90 tablet; Refill: 1   2. Weight gain Start with bloodwork; further discussion pending results - TSH; Future - TSH  4. Iron deficiency Not been taking supplement. Will get new baseline. - CBC with Differential/Platelet; Future - IBC + Ferritin; Future - IBC + Ferritin - CBC with Differential/Platelet  5. Elevated fasting glucose  Concern with carb craving, weight gain. - Comprehensive metabolic panel; Future - Hemoglobin A1c; Future - Hemoglobin A1c - Comprehensive metabolic panel  6. Bacterial vaginitis Recurrent. Use flagyl tablet now; then twice weekly metrogel to keep infection down. Breathable underwear. Avoid bathing. - metroNIDAZOLE (FLAGYL) 500 MG tablet; Take 1 tablet (500 mg total) by mouth 2 (two) times daily.  Dispense: 14 tablet; Refill: 0 - metroNIDAZOLE (METROGEL) 0.75 % vaginal gel; Place 1 Applicatorful vaginally 2 (two) times a week.  Dispense: 70 g; Refill: 1     Follow up 6 months for physical, bloodwork prior Micheline Rough, MD

## 2019-06-27 NOTE — Addendum Note (Signed)
Addended by: Agnes Lawrence on: 06/27/2019 02:21 PM   Modules accepted: Orders

## 2019-07-10 ENCOUNTER — Encounter: Payer: Self-pay | Admitting: *Deleted

## 2019-08-29 ENCOUNTER — Telehealth: Payer: Self-pay | Admitting: Family Medicine

## 2019-08-29 ENCOUNTER — Other Ambulatory Visit: Payer: Self-pay | Admitting: Family Medicine

## 2019-08-29 DIAGNOSIS — R2240 Localized swelling, mass and lump, unspecified lower limb: Secondary | ICD-10-CM

## 2019-08-29 NOTE — Telephone Encounter (Signed)
Pt stated that back in 2018 Dr. Maudie Mercury referred her to an Orthopedic Surgery for the mass on her plantar foot. She stated that it keeps growing and she would like to be seen to have it removed since the doctor told her that is the only way to get rid of it. She will go to wherever [facility] recommends.    Pt can be reached at [phone] -ok to leave a detailed message per pt

## 2019-08-29 NOTE — Telephone Encounter (Signed)
I put in referral to a podiatrist that I recommend (Dr. Jacqualyn Posey)

## 2019-08-29 NOTE — Telephone Encounter (Signed)
I left a detailed message with the information below at the pts cell number. 

## 2019-09-17 ENCOUNTER — Ambulatory Visit (INDEPENDENT_AMBULATORY_CARE_PROVIDER_SITE_OTHER): Payer: BC Managed Care – PPO

## 2019-09-17 ENCOUNTER — Other Ambulatory Visit: Payer: Self-pay | Admitting: Podiatry

## 2019-09-17 ENCOUNTER — Encounter: Payer: Self-pay | Admitting: Podiatry

## 2019-09-17 ENCOUNTER — Telehealth: Payer: Self-pay

## 2019-09-17 ENCOUNTER — Ambulatory Visit (INDEPENDENT_AMBULATORY_CARE_PROVIDER_SITE_OTHER): Payer: BC Managed Care – PPO | Admitting: Podiatry

## 2019-09-17 ENCOUNTER — Other Ambulatory Visit: Payer: Self-pay

## 2019-09-17 VITALS — BP 129/82 | HR 78 | Temp 98.1°F | Resp 14

## 2019-09-17 DIAGNOSIS — M7989 Other specified soft tissue disorders: Secondary | ICD-10-CM

## 2019-09-17 DIAGNOSIS — M722 Plantar fascial fibromatosis: Secondary | ICD-10-CM

## 2019-09-17 NOTE — Progress Notes (Signed)
Knee scooter ordered 

## 2019-09-17 NOTE — Telephone Encounter (Signed)
DOS 09/26/19  PLANTAR FIBROMA LT - 28062 INJECTION RT - 20550  [employer]BS EFFECTIVE DATE - 06/07/2018     In-Network   Max Per Benefit Period Year-to-Date Remaining     CoInsurance         Deductible $3,500.00 $3,232.50     Out-Of-Pocket 3 $6,850.00 [id number]   Copay Not Applicable Coinsurance [id number]  per  Service Year Authorization Required No

## 2019-09-17 NOTE — Patient Instructions (Signed)

## 2019-09-21 ENCOUNTER — Telehealth: Payer: Self-pay | Admitting: Podiatry

## 2019-09-21 NOTE — Telephone Encounter (Signed)
Note is complete

## 2019-09-21 NOTE — Progress Notes (Signed)
Subjective:   Patient ID: Diamond Hansen, female   DOB: 44 y.o.   MRN: [id number]   HPI 44 year old female presents the office today for concerns of a soft tissue mass in the bottom of her left foot.  Present on the last several years there has become larger and causing pain as well as have the area removed.  She also started to have a lesion on the right foot was not as painful and is much smaller.  She said no treatment on the right side.  She denies recent injury or trauma.  There is pain provoked with walking and pressure.  She is try to modifications, offloading the insignificant improvement.  Review of Systems  All other systems reviewed and are negative.  Past Medical History:  Diagnosis Date  . Anemia   . Anxiety   . GERD (gastroesophageal reflux disease)     Past Surgical History:  Procedure Laterality Date  . BREAST LUMPECTOMY WITH RADIOACTIVE SEED LOCALIZATION Left 03/21/2019   Procedure: LEFT BREAST LUMPECTOMY WITH BRACKETED RADIOACTIVE SEED LOCALIZATION;  Surgeon: Coralie Keens, MD;  Location: [facility];  Service: General;  Laterality: Left;  . TUBAL LIGATION    . UMBILICAL HERNIA REPAIR     age 51     Current Outpatient Medications:  .  sertraline (ZOLOFT) 50 MG tablet, Take 1 tablet (50 mg total) by mouth daily., Disp: 90 tablet, Rfl: 1  No Known Allergies        Objective:  Physical Exam  General: AAO x3, NAD  Dermatological: Skin is warm, dry and supple bilateral. Nails x 10 are well manicured; remaining integument appears unremarkable at this time. There are no open sores, no preulcerative lesions, no rash or signs of infection present.  Vascular: Dorsalis Pedis artery and Posterior Tibial artery pedal pulses are 2/4 bilateral with immedate capillary fill time. Pedal hair growth present. No varicosities and no lower extremity edema present bilateral. There is no pain with calf compression, swelling, warmth, erythema.   Neruologic: Grossly intact via light  touch bilateral. Vibratory intact via tuning fork bilateral. Patellar and Achilles deep tendon reflexes 2+ bilateral. No Babinski or clonus noted bilateral.   Musculoskeletal: From soft tissue mass present along the medial band plantar fashion the arch of the foot measuring proximally 2 cm.  It is tender to palpate.  Also a smaller lesion present in the same area of the right foot but no significant tenderness palpation.  The lesions do appear to be a part of the plantar fascia.  Muscular strength 5/5 in all groups tested bilateral.  Gait: Unassisted, Nonantalgic.       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     Plan:  -Treatment options discussed including all alternatives, risks, and complications -Etiology of symptoms were discussed -X-rays obtained and reviewed there is no evidence of acute fracture or calcifications. -We discussed both conservative as well as surgical options.  This time she also proceed with surgical excision of the area.  She is attempted conservative care including shoe modifications and offloading the insignificant improvement. -We will plan for left foot soft tissue mass excision, plantar fibroma as well as steroid injection to the soft tissue mass in the right foot. -The incision placement as well as the postoperative course was discussed with the patient. I discussed risks of the surgery which include, but not limited to, infection, bleeding, pain, swelling, need for further surgery, delayed or nonhealing, painful or ugly scar, numbness or sensation changes,  over/under correction, recurrence, transfer lesions, further deformity, hardware failure, DVT/PE, loss of toe/foot. Patient understands these risks and wishes to proceed with surgery. The surgical consent was reviewed with the patient all 3 pages were signed. No promises or guarantees were given to the outcome of the procedure. All questions were answered to the best of my ability. Before  the surgery the patient was encouraged to call the office if there is any further questions. The surgery will be performed at the [facility] on an outpatient basis.  Trula Slade DPM

## 2019-09-21 NOTE — Telephone Encounter (Signed)
Good Morning, I was waiting on office note to complete FMLA paperwork. Thank you

## 2019-09-21 NOTE — Telephone Encounter (Signed)
Good Morning, c

## 2019-09-26 ENCOUNTER — Encounter: Payer: Self-pay | Admitting: Podiatry

## 2019-09-26 ENCOUNTER — Other Ambulatory Visit: Payer: Self-pay | Admitting: Podiatry

## 2019-09-26 DIAGNOSIS — M722 Plantar fascial fibromatosis: Secondary | ICD-10-CM

## 2019-09-26 HISTORY — PX: FOOT SURGERY: SHX648

## 2019-09-26 MED ORDER — PROMETHAZINE HCL 25 MG PO TABS: 25.0000 mg | ORAL_TABLET | Freq: Three times a day (TID) | ORAL | 0 refills | Status: DC | PRN

## 2019-09-26 MED ORDER — OXYCODONE-ACETAMINOPHEN 5-325 MG PO TABS: 1.0000 | ORAL_TABLET | Freq: Four times a day (QID) | ORAL | 0 refills | Status: DC | PRN

## 2019-09-26 MED ORDER — CEPHALEXIN 500 MG PO CAPS: 500.0000 mg | ORAL_CAPSULE | Freq: Three times a day (TID) | ORAL | 0 refills | Status: DC

## 2019-09-26 NOTE — Progress Notes (Signed)
Post-op medications sent to the pharmacy.  

## 2019-09-27 ENCOUNTER — Telehealth: Payer: Self-pay | Admitting: *Deleted

## 2019-09-27 NOTE — Telephone Encounter (Signed)
Called the patient and patient stated that she has been using ibuprofen and that is helping and there is not any fever or chills and not any nausea and I am doing fine and the toes are still numb so I took the boot off and that seem to help the toes and the ace wrap is still wrapped and I stated to the patient to elevate and use the ice pack in the crease of the knee and leave on there for about 15 minutes on the hour and to call the [facility] office if any concerns or questions at [phone]. Diamond Hansen

## 2019-10-02 ENCOUNTER — Other Ambulatory Visit: Payer: Self-pay

## 2019-10-02 ENCOUNTER — Ambulatory Visit (INDEPENDENT_AMBULATORY_CARE_PROVIDER_SITE_OTHER): Payer: BC Managed Care – PPO | Admitting: Podiatrist

## 2019-10-02 ENCOUNTER — Encounter: Payer: Self-pay | Admitting: Podiatrist

## 2019-10-02 DIAGNOSIS — Z9889 Other specified postprocedural states: Secondary | ICD-10-CM

## 2019-10-02 DIAGNOSIS — M722 Plantar fascial fibromatosis: Secondary | ICD-10-CM

## 2019-10-02 NOTE — Progress Notes (Signed)
Chief Complaint  Patient presents with  . Routine Post Op    i have been crawling around and i did not have any crutches or knee scooter was to be ordered and never heard anything     Subjective: Patient presents today1 week status post foot surgery of the left foot-  Excision plantar fibroma left instep and injection of right plantar fibroma.   Patient denies nausea, vomiting, fevers, chills or night sweats.  Denies calf pain or tenderness to the operative side.  Objective:  Neurovascular status is intact with palpable pedal pulses DP and PT bilateral at 2+ out of 4. Neurological sensation is intact and unchanged as per prior to surgery. Excellent appearance of the postoperative foot is noted.  Dressing is intact upon presenting to the office and once removed sutures are noted to be in place with incision site well coapted.  Minimal swelling and ecchymosis present.  Overall excellent post op appearance.     Assessment: Status post left foot excision plantar fibroma date of surgery 09/26/2019  Plan: Redressed the foot and a dry sterile compressive dressing.  She is to continue wearing the boot and to keep the foot dry.  She will be seen back as scheduled for her next postoperative appointment.  Also note we did loan her a rolling knee scooter at today's visit.  She understands she will need to return it once she is no longer to be nonweightbearing.  Patient is amenable and happy to have the scooter.

## 2019-10-02 NOTE — Patient Instructions (Signed)
Keep your dressing on and continue wearing your boot-  Use your crutches to keep the pressure off your foot.    Your foot looks great!! You have been doing an awesome job keeping the weight off the foot and it is healing well!

## 2019-10-11 ENCOUNTER — Other Ambulatory Visit: Payer: Self-pay

## 2019-10-11 ENCOUNTER — Ambulatory Visit (INDEPENDENT_AMBULATORY_CARE_PROVIDER_SITE_OTHER): Payer: BC Managed Care – PPO | Admitting: Podiatry

## 2019-10-11 ENCOUNTER — Encounter: Payer: Self-pay | Admitting: Podiatry

## 2019-10-11 VITALS — Temp 97.5°F

## 2019-10-11 DIAGNOSIS — M722 Plantar fascial fibromatosis: Secondary | ICD-10-CM

## 2019-10-11 NOTE — Progress Notes (Signed)
Subjective: Diamond Hansen is a 44 y.o. is seen today in office s/p left foot plantar fibroma excision and right plantar fibroma steroid injection. They state their pain is improving. She only takes pain medication as needed and does not need a refill. She does put weight on her heel at times, but in the boot. Denies any systemic complaints such as fevers, chills, nausea, vomiting. No calf pain, chest pain, shortness of breath.   Objective: General: No acute distress, AAOx3  DP/PT pulses palpable 2/4, CRT < 3 sec to all digits.  Protective sensation intact. Motor function intact.  LEFT foot: Incision is well coapted without any evidence of dehiscence. There is no surrounding erythema, ascending cellulitis, fluctuance, crepitus, malodor, drainage/purulence. There is minimal edema around the surgical site. There is no pain along the surgical site.  RIGHT foot: plantar fibroma is much improved. No pain to the area and no edema or erythema.  No other areas of tenderness to bilateral lower extremities.  No other open lesions or pre-ulcerative lesions.  No pain with calf compression, swelling, warmth, erythema.   Assessment and Plan:  Status post plantar fibroma excision, doing well with no complications   -Treatment options discussed including all alternatives, risks, and complications -Follow-up with sutures in place today.  Antibiotic ointment and a bandage applied.  Plan removal of the sutures next appointment.  Continue nonweightbearing in cam boot. -Ice/elevation -Pain medication as needed. -Monitor for any clinical signs or symptoms of infection and DVT/PE and directed to call the office immediately should any occur or go to the ER. -Follow-up in 1 week for suture removal or sooner if any problems arise. In the meantime, encouraged to call the office with any questions, concerns, change in symptoms.   Celesta Gentile, DPM

## 2019-10-15 ENCOUNTER — Other Ambulatory Visit: Payer: BC Managed Care – PPO

## 2019-10-25 ENCOUNTER — Other Ambulatory Visit: Payer: Self-pay

## 2019-10-25 ENCOUNTER — Ambulatory Visit (INDEPENDENT_AMBULATORY_CARE_PROVIDER_SITE_OTHER): Payer: BC Managed Care – PPO | Admitting: Podiatry

## 2019-10-25 ENCOUNTER — Encounter: Payer: Self-pay | Admitting: Podiatry

## 2019-10-25 DIAGNOSIS — M722 Plantar fascial fibromatosis: Secondary | ICD-10-CM

## 2019-10-25 DIAGNOSIS — Z9889 Other specified postprocedural states: Secondary | ICD-10-CM

## 2019-10-27 NOTE — Progress Notes (Signed)
Subjective: Diamond Hansen is a 44 y.o. is seen today in office s/p left foot plantar fibroma excision and right plantar fibroma steroid injection.  She states that she is doing well.  She previously procedure removal.  She has no concerns today. Denies any systemic complaints such as fevers, chills, nausea, vomiting. No calf pain, chest pain, shortness of breath.   Objective: General: No acute distress, AAOx3  DP/PT pulses palpable 2/4, CRT < 3 sec to all digits.  Protective sensation intact. Motor function intact.  LEFT foot: Incision is well coapted without any evidence of dehiscence with sutures intact. There is no surrounding erythema, ascending cellulitis, fluctuance, crepitus, malodor, drainage/purulence. There is trace edema around the surgical site. There is no pain along the surgical site.  RIGHT foot: plantar fibroma is much improved. No pain to the area and no edema or erythema.  No other areas of tenderness to bilateral lower extremities.  No other open lesions or pre-ulcerative lesions.  No pain with calf compression, swelling, warmth, erythema.   Assessment and Plan:  Status post plantar fibroma excision, doing well with no complications   -Treatment options discussed including all alternatives, risks, and complications -Sutures removed today.  After removal incision remained well coapted.  Continue antibiotic ointment dressing changes daily.  She can start to shower but incision wet but dry thoroughly.  Continue nonweightbearing for the next couple of days as she starts to feel better she can transition to partial weightbearing for weight-bear as tolerated in the cam boot monitor incision closely.  Continue to ice elevate as well. -Right foot is doing well however.  Seems of the room is coming back will do another steroid injection in the office.  Return in about 3 weeks (around 11/15/2019).  Trula Slade DPM

## 2019-11-06 ENCOUNTER — Other Ambulatory Visit: Payer: Self-pay

## 2019-11-06 ENCOUNTER — Other Ambulatory Visit (INDEPENDENT_AMBULATORY_CARE_PROVIDER_SITE_OTHER): Payer: BC Managed Care – PPO

## 2019-11-06 DIAGNOSIS — E611 Iron deficiency: Secondary | ICD-10-CM | POA: Diagnosis not present

## 2019-11-06 LAB — CBC WITH DIFFERENTIAL/PLATELET
Basophils Absolute: 0 10*3/uL (ref 0.0–0.1)
Basophils Relative: 0.6 % (ref 0.0–3.0)
Eosinophils Absolute: 0.1 10*3/uL (ref 0.0–0.7)
Eosinophils Relative: 1.8 % (ref 0.0–5.0)
HCT: 38.6 % (ref 36.0–46.0)
Hemoglobin: 12.5 g/dL (ref 12.0–15.0)
Lymphocytes Relative: 15.1 % (ref 12.0–46.0)
Lymphs Abs: 1 10*3/uL (ref 0.7–4.0)
MCHC: 32.4 g/dL (ref 30.0–36.0)
MCV: 82.5 fl (ref 78.0–100.0)
Monocytes Absolute: 0.6 10*3/uL (ref 0.1–1.0)
Monocytes Relative: 8.6 % (ref 3.0–12.0)
Neutro Abs: 4.9 10*3/uL (ref 1.4–7.7)
Neutrophils Relative %: 73.9 % (ref 43.0–77.0)
Platelets: 364 10*3/uL (ref 150.0–400.0)
RBC: 4.68 Mil/uL (ref 3.87–5.11)
RDW: 15 % (ref 11.5–15.5)
WBC: 6.6 10*3/uL (ref 4.0–10.5)

## 2019-11-07 LAB — IRON,TIBC AND FERRITIN PANEL
%SAT: 9 % (calc) — ABNORMAL LOW (ref 16–45)
Ferritin: 45 ng/mL (ref 16–232)
Iron: 36 ug/dL — ABNORMAL LOW (ref 40–190)
TIBC: 408 mcg/dL (calc) (ref 250–450)

## 2019-11-12 ENCOUNTER — Encounter: Payer: Self-pay | Admitting: Podiatry

## 2019-11-12 ENCOUNTER — Ambulatory Visit (INDEPENDENT_AMBULATORY_CARE_PROVIDER_SITE_OTHER): Payer: BC Managed Care – PPO | Admitting: Podiatry

## 2019-11-12 ENCOUNTER — Other Ambulatory Visit: Payer: Self-pay

## 2019-11-12 DIAGNOSIS — M722 Plantar fascial fibromatosis: Secondary | ICD-10-CM

## 2019-11-12 DIAGNOSIS — Z9889 Other specified postprocedural states: Secondary | ICD-10-CM

## 2019-11-12 NOTE — Progress Notes (Signed)
Subjective: Diamond Hansen is a 44 y.o. is seen today in office s/p left foot plantar fibroma excision and right plantar fibroma steroid injection.  Overall she is still improved but still in some sensitivity to the bottom of her left foot.  She is been walking the cam boot and doing well but she has not been able to back to regular shoe quite yet.  She does feel a pulling sensation when she tries to stretch.  No recent injury or falls. Denies any systemic complaints such as fevers, chills, nausea, vomiting. No calf pain, chest pain, shortness of breath.   Objective: General: No acute distress, AAOx3  DP/PT pulses palpable 2/4, CRT < 3 sec to all digits.  Protective sensation intact. Motor function intact.  LEFT foot: Incision is well coapted without any evidence of dehiscence and the incision is healing well.  There is surrounding erythema, ascending cellulitis.  No drainage or pus.  Mild tenderness palpation with minimal swelling.  RIGHT foot: plantar fibroma is much improved. No pain to the area and no edema or erythema.  No other areas of tenderness to bilateral lower extremities.  No other open lesions or pre-ulcerative lesions.  No pain with calf compression, swelling, warmth, erythema.   Assessment and Plan:  Status post plantar fibroma excision, doing well with no complications   -Treatment options discussed including all alternatives, risks, and complications -Doing better.  She is able to ambulate in the cam boot without any issues but not able to go back to regular shoe.  Will refer to physical therapy.  Prescription for [facility] was written today.  Continue ice elevate and home stretching exercises.  Continue with compression anklet.  Return in about 3 weeks (around 12/03/2019).  Trula Slade DPM

## 2019-11-14 ENCOUNTER — Encounter: Payer: Self-pay | Admitting: Podiatry

## 2019-11-29 ENCOUNTER — Other Ambulatory Visit: Payer: Self-pay

## 2019-11-29 ENCOUNTER — Ambulatory Visit (INDEPENDENT_AMBULATORY_CARE_PROVIDER_SITE_OTHER): Payer: BC Managed Care – PPO | Admitting: Podiatry

## 2019-11-29 DIAGNOSIS — M722 Plantar fascial fibromatosis: Secondary | ICD-10-CM

## 2019-11-29 DIAGNOSIS — M7989 Other specified soft tissue disorders: Secondary | ICD-10-CM

## 2019-11-30 NOTE — Progress Notes (Signed)
Subjective: Diamond Hansen is a 44 y.o. is seen today in office s/p left foot plantar fibroma excision and right plantar fibroma steroid injection.  She says overall she is doing better.  She is still doing physical therapy and has been helpful.  She still in the cam boot on the left side but walking.  She does feel that stretching when she walks.  Also the right side is been doing well and she is not ready for another injection but she does feel that area is tender at times.  She is scheduled go back to work next week but not sure she is ready to do this. Denies any systemic complaints such as fevers, chills, nausea, vomiting. No calf pain, chest pain, shortness of breath.   Objective: General: No acute distress, AAOx3  DP/PT pulses palpable 2/4, CRT < 3 sec to all digits.  Protective sensation intact. Motor function intact.  LEFT foot: Incision is well coapted without any evidence of dehiscence and the incision is healing well.  There is mild tenderness palpation gently on the scar.  She describes a pulling sensation on the scar.  There is minimal edema.  There is no erythema or warmth or any signs of infection noted today.  RIGHT foot: plantar fibroma is much improved. No pain to the area and no edema or erythema.  No other areas of tenderness to bilateral lower extremities.  No other open lesions or pre-ulcerative lesions.  No pain with calf compression, swelling, warmth, erythema.   Assessment and Plan:  Status post plantar fibroma excision, doing well with no complications   -Treatment options discussed including all alternatives, risks, and complications -This time discussed continue with physical therapy.  She can start to transition to regular shoe as tolerated.  Also want her to continue physical therapy and stretching on the right side as well.  She is not quite ready to back to work will extend her for an additional month before going back to work.  Return in about 4 weeks (around  12/27/2019).  Trula Slade DPM

## 2019-12-27 ENCOUNTER — Ambulatory Visit (INDEPENDENT_AMBULATORY_CARE_PROVIDER_SITE_OTHER): Payer: BC Managed Care – PPO | Admitting: Podiatry

## 2019-12-27 ENCOUNTER — Other Ambulatory Visit: Payer: Self-pay

## 2019-12-27 DIAGNOSIS — M722 Plantar fascial fibromatosis: Secondary | ICD-10-CM

## 2019-12-27 DIAGNOSIS — M7989 Other specified soft tissue disorders: Secondary | ICD-10-CM

## 2019-12-31 NOTE — Progress Notes (Signed)
Subjective: Diamond Hansen is a 44 y.o. is seen today in office s/p left foot plantar fibroma excision and right plantar fibroma steroid injection.  She states that she is having some discomfort as well as some mild swelling to the bottom of her foot.  She is wearing a regular shoe today.  No recent injury or falls. Denies any systemic complaints such as fevers, chills, nausea, vomiting. No calf pain, chest pain, shortness of breath.   Objective: General: No acute distress, AAOx3  DP/PT pulses palpable 2/4, CRT < 3 sec to all digits.  Protective sensation intact. Motor function intact.  LEFT foot: Incision is well coapted without any evidence of dehiscence and the incision is healing well.  There is minimal swelling to the area there is no erythema or warmth there is no areas of fluctuation.  Mild hypertrophy of the scar.  There is no surrounding erythema, ascending cellulitis or any signs of infection.  RIGHT foot: plantar fibroma is much improved. No pain to the area and no edema or erythema.  No other areas of tenderness to bilateral lower extremities.  No other open lesions or pre-ulcerative lesions.  No pain with calf compression, swelling, warmth, erythema.   Assessment and Plan:  Status post plantar fibroma excision, doing well with no complications   -Treatment options discussed including all alternatives, risks, and complications -I ordered a compound cream [facility] through [facility] for the scarring.  I want her to continue with gentle massage of the area as well as stretching, icing daily.  Continue physical therapy.  Gradually increasing back to her activity level.  Return in about 2 weeks (around 01/10/2020).  Trula Slade DPM

## 2020-01-10 ENCOUNTER — Ambulatory Visit (INDEPENDENT_AMBULATORY_CARE_PROVIDER_SITE_OTHER): Payer: BC Managed Care – PPO | Admitting: Podiatry

## 2020-01-10 ENCOUNTER — Other Ambulatory Visit: Payer: Self-pay

## 2020-01-10 DIAGNOSIS — M7989 Other specified soft tissue disorders: Secondary | ICD-10-CM

## 2020-01-10 DIAGNOSIS — M722 Plantar fascial fibromatosis: Secondary | ICD-10-CM

## 2020-01-13 NOTE — Progress Notes (Signed)
Subjective: Diamond Hansen is a 44 y.o. is seen today in office s/p left foot plantar fibroma excision and right plantar fibroma steroid injection.  States that she is doing better.  She still gets some occasional soreness that she states "it's fine".  Denies any swelling or redness.  She has still been doing physical therapy, stretching and wearing surgical shoe and elevating.  She has no other concerns today. Denies any systemic complaints such as fevers, chills, nausea, vomiting. No calf pain, chest pain, shortness of breath.   Objective: General: No acute distress, AAOx3  DP/PT pulses palpable 2/4, CRT < 3 sec to all digits.  Protective sensation intact. Motor function intact.  LEFT foot: Incision is well coapted without any evidence of dehiscence and the incision is healing well and a scar is well formed.  There is minimal tenderness to the incision most with localized on the very proximal aspect and there is one thicker area of scar tissue and this is where the tenderness is localized.  There is no edema, erythema there is no drainage or pus or any signs of infection.  No areas of fluctuation.  No other areas of discomfort identified today.  No other open lesions or pre-ulcerative lesions.  No pain with calf compression, swelling, warmth, erythema.   Assessment and Plan:  Status post plantar fibroma excision, doing well with no complications   -Treatment options discussed including all alternatives, risks, and complications -She cannot get the compound cream due to cost we discussed using cocoa butter or vitamin E cream on the incision daily to moisturize.  Discussed over-the-counter scar cream as well.  She can transition back to regular shoe as tolerated.  Continue physical therapy, stretching, icing daily.  Return in about 4 weeks (around 02/07/2020).  Trula Slade DPM

## 2020-01-14 ENCOUNTER — Telehealth: Payer: Self-pay | Admitting: Podiatry

## 2020-01-14 NOTE — Telephone Encounter (Signed)
Patient would like extension on leave. Please advise.

## 2020-01-14 NOTE — Telephone Encounter (Signed)
OK to extend a few more weeks

## 2020-01-25 ENCOUNTER — Ambulatory Visit (INDEPENDENT_AMBULATORY_CARE_PROVIDER_SITE_OTHER): Payer: BC Managed Care – PPO | Admitting: Family Medicine

## 2020-01-25 ENCOUNTER — Other Ambulatory Visit (HOSPITAL_COMMUNITY)
Admission: RE | Admit: 2020-01-25 | Discharge: 2020-01-25 | Disposition: A | Discharge status: Home / Self Care | Payer: BC Managed Care – PPO | Source: Ambulatory Visit | Attending: Family Medicine | Admitting: Family Medicine

## 2020-01-25 ENCOUNTER — Encounter: Payer: Self-pay | Admitting: Family Medicine

## 2020-01-25 ENCOUNTER — Other Ambulatory Visit: Payer: Self-pay

## 2020-01-25 VITALS — BP 130/82 | HR 83 | Temp 98.2°F | Wt 209.8 lb

## 2020-01-25 DIAGNOSIS — E559 Vitamin D deficiency, unspecified: Secondary | ICD-10-CM

## 2020-01-25 DIAGNOSIS — R739 Hyperglycemia, unspecified: Secondary | ICD-10-CM

## 2020-01-25 DIAGNOSIS — Z0000 Encounter for general adult medical examination without abnormal findings: Secondary | ICD-10-CM

## 2020-01-25 DIAGNOSIS — D649 Anemia, unspecified: Secondary | ICD-10-CM

## 2020-01-25 DIAGNOSIS — L03115 Cellulitis of right lower limb: Secondary | ICD-10-CM

## 2020-01-25 DIAGNOSIS — N920 Excessive and frequent menstruation with regular cycle: Secondary | ICD-10-CM

## 2020-01-25 DIAGNOSIS — E611 Iron deficiency: Secondary | ICD-10-CM | POA: Diagnosis not present

## 2020-01-25 DIAGNOSIS — Z13220 Encounter for screening for lipoid disorders: Secondary | ICD-10-CM

## 2020-01-25 MED ORDER — CEPHALEXIN 500 MG PO CAPS: 500.0000 mg | ORAL_CAPSULE | Freq: Three times a day (TID) | ORAL | 0 refills | Status: DC

## 2020-01-25 NOTE — Progress Notes (Signed)
Diamond Hansen DOB: 03/13/1976 Encounter date: 01/25/2020  This is a 44 y.o. female who presents for complete physical   History of present illness/Additional concerns:  Bit by wasp on Monday - on back of heel. Itches. Swelling is worse now than it was on Monday. Swelling started on weds.   Has been out of work for 4 months since foot surgery.   Anxiety: mood doing well overall. Sertraline seems to help. Not having bad days like she used to.   Some days feels a little dizzy; then will take 2 iron pills. Hasn't always been consistent with these. Periods have sometimes been heavy. Period started last weds; thinks gone now, but lingering in last couple days.   Past Medical History:  Diagnosis Date   Anemia    Anxiety    GERD (gastroesophageal reflux disease)    Past Surgical History:  Procedure Laterality Date   BREAST LUMPECTOMY WITH RADIOACTIVE SEED LOCALIZATION Left 03/21/2019   Procedure: LEFT BREAST LUMPECTOMY WITH BRACKETED RADIOACTIVE SEED LOCALIZATION;  Surgeon: Coralie Keens, MD;  Location: [facility];  Service: General;  Laterality: Left;   FOOT SURGERY Left 09/26/2019   TUBAL LIGATION     UMBILICAL HERNIA REPAIR     age 80   No Known Allergies Current Meds  Medication Sig   ibuprofen (ADVIL) 600 MG tablet Take 600 mg by mouth every 6 (six) hours as needed.   sertraline (ZOLOFT) 50 MG tablet Take 1 tablet (50 mg total) by mouth daily.   Social History   Tobacco Use   Smoking status: Former Smoker   Smokeless tobacco: Never Used   Tobacco comment: quit in 2011, smoke on and off for 8 years  Substance Use Topics   Alcohol use: No    Alcohol/week: 0.0 standard drinks    Comment: occassionally   Family History  Problem Relation Age of Onset   Hypertension Father    Cancer Father        unknown type   Diabetes Brother    High blood pressure Mother    Diabetes Maternal Grandmother    High blood pressure Maternal Grandmother    Diabetes Maternal  Grandfather    High blood pressure Maternal Grandfather    Diabetes Paternal Grandmother    High blood pressure Paternal Grandmother    Diabetes Paternal Grandfather    High blood pressure Paternal Grandfather    Multiple sclerosis Paternal Uncle      Review of Systems  Constitutional: Negative for activity change, appetite change, chills, fatigue, fever and unexpected weight change.  HENT: Negative for congestion, ear pain, hearing loss, sinus pressure, sinus pain, sore throat and trouble swallowing.   Eyes: Negative for pain and visual disturbance.  Respiratory: Negative for cough, chest tightness, shortness of breath and wheezing.   Cardiovascular: Negative for chest pain, palpitations and leg swelling.  Gastrointestinal: Negative for abdominal pain, blood in stool, constipation, diarrhea, nausea and vomiting.  Genitourinary: Negative for difficulty urinating and menstrual problem.  Musculoskeletal: Negative for arthralgias and back pain.  Skin: Negative for rash.  Neurological: Negative for dizziness, weakness, numbness and headaches.  Hematological: Negative for adenopathy. Does not bruise/bleed easily.  Psychiatric/Behavioral: Negative for sleep disturbance and suicidal ideas. The patient is not nervous/anxious.     CBC:  Lab Results  Component Value Date   WBC 6.6 11/06/2019   HGB 12.5 11/06/2019   HCT 38.6 11/06/2019   MCH 25.4 (L) 03/14/2019   MCHC 32.4 11/06/2019   RDW 15.0 11/06/2019  PLT 364.0 11/06/2019   MPV 9.0 07/28/2016   CMP: Lab Results  Component Value Date   NA 140 06/25/2019   K 3.9 06/25/2019   CL 107 06/25/2019   CO2 23 06/25/2019   GLUCOSE 91 06/25/2019   BUN 9 06/25/2019   CREATININE 0.73 06/25/2019   CALCIUM 9.1 06/25/2019   PROT 7.3 06/25/2019   BILITOT 0.2 06/25/2019   ALKPHOS 64 06/25/2019   ALT 9 06/25/2019   AST 10 06/25/2019   LIPID: Lab Results  Component Value Date   CHOL 158 11/27/2018   TRIG 90.0 11/27/2018   HDL  74.00 11/27/2018   LDLCALC 66 11/27/2018    Objective:  BP 130/82 (BP Location: Right Arm, Patient Position: Sitting, Cuff Size: Large)    Pulse 83    Temp 98.2 F (36.8 C) (Oral)    Wt 209 lb 12.8 oz (95.2 kg)    LMP 01/16/2020    SpO2 95%    BMI 32.86 kg/m   Weight: 209 lb 12.8 oz (95.2 kg)   BP Readings from Last 3 Encounters:  01/25/20 130/82  09/17/19 129/82  06/25/19 120/84   Wt Readings from Last 3 Encounters:  01/25/20 209 lb 12.8 oz (95.2 kg)  06/25/19 187 lb 4 oz (84.9 kg)  03/30/19 184 lb 8 oz (83.7 kg)    Physical Exam Exam conducted with a chaperone present.  Constitutional:      General: She is not in acute distress.    Appearance: She is well-developed.  HENT:     Head: Normocephalic and atraumatic.     Right Ear: External ear normal.     Left Ear: External ear normal.     Mouth/Throat:     Pharynx: No oropharyngeal exudate.  Eyes:     Conjunctiva/sclera: Conjunctivae normal.     Pupils: Pupils are equal, round, and reactive to light.  Neck:     Thyroid: No thyromegaly.  Cardiovascular:     Rate and Rhythm: Normal rate and regular rhythm.     Heart sounds: Normal heart sounds. No murmur heard.  No friction rub. No gallop.   Pulmonary:     Effort: Pulmonary effort is normal.     Breath sounds: Normal breath sounds.  Abdominal:     General: Bowel sounds are normal. There is no distension.     Palpations: Abdomen is soft. There is no mass.     Tenderness: There is no abdominal tenderness. There is no guarding.     Hernia: No hernia is present.  Genitourinary:    Exam position: Supine.     Vagina: Normal.     Cervix: Erythema and cervical bleeding present.     Uterus: Normal.      Adnexa: Right adnexa normal and left adnexa normal.  Musculoskeletal:        General: No tenderness or deformity. Normal range of motion.     Cervical back: Normal range of motion and neck supple.  Lymphadenopathy:     Cervical: No cervical adenopathy.  Skin:     General: Skin is warm and dry.     Findings: No rash.     Comments: Right posterior ankle erythema, undefined, edema noted right foot/ankle.  Neurological:     Mental Status: She is alert and oriented to person, place, and time.     Deep Tendon Reflexes: Reflexes normal.     Reflex Scores:      Tricep reflexes are 2+ on the right side and 2+ on  the left side.      Bicep reflexes are 2+ on the right side and 2+ on the left side.      Brachioradialis reflexes are 2+ on the right side and 2+ on the left side.      Patellar reflexes are 2+ on the right side and 2+ on the left side. Psychiatric:        Speech: Speech normal.        Behavior: Behavior normal.        Thought Content: Thought content normal.     Assessment/Plan: Health Maintenance Due  Topic Date Due   COVID-19 Vaccine (1) Never done   PAP SMEAR-Modifier  07/29/2019   INFLUENZA VACCINE  01/06/2020   Health Maintenance reviewed - encouraged her to get COVID vaccine; reviewed pros>cons. 1. Preventative health care Discussed importance of regular exercise (should be easier now that foot is doing better) - HIV Antibody (routine testing w rflx); Future - RPR; Future - Hepatitis C antibody; Future  2. Menorrhagia with regular cycle We will check blood work today. - CBC with Differential/Platelet; Future - TSH; Future - Iron, TIBC and Ferritin Panel; Future  3. Iron deficiency Checking blood work.  She is not always good about taking iron supplementation.  But does feel better when she takes it.  4. Hyperglycemia - Hemoglobin A1c; Future - Comprehensive metabolic panel; Future  5. Lipid screening - Lipid panel; Future  6. Anemia, unspecified type - Vitamin B12; Future  7. Vitamin D deficiency - VITAMIN D 25 Hydroxy (Vit-D Deficiency, Fractures); Future  8. Cellulitis of right foot Keflex as directed.  Let me know if any worsening or if the swelling and redness do not resolve after treatment.  Return in  about 6 months (around 07/27/2020) for Chronic condition visit.  Micheline Rough, MD

## 2020-01-28 LAB — COMPREHENSIVE METABOLIC PANEL
AG Ratio: 1.5 (calc) (ref 1.0–2.5)
ALT: 12 U/L (ref 6–29)
AST: 13 U/L (ref 10–30)
Albumin: 4.3 g/dL (ref 3.6–5.1)
Alkaline phosphatase (APISO): 75 U/L (ref 31–125)
BUN: 11 mg/dL (ref 7–25)
CO2: 29 mmol/L (ref 20–32)
Calcium: 9.5 mg/dL (ref 8.6–10.2)
Chloride: 102 mmol/L (ref 98–110)
Creat: 0.8 mg/dL (ref 0.50–1.10)
Globulin: 2.8 g/dL (calc) (ref 1.9–3.7)
Glucose, Bld: 87 mg/dL (ref 65–99)
Potassium: 3.9 mmol/L (ref 3.5–5.3)
Sodium: 139 mmol/L (ref 135–146)
Total Bilirubin: 0.3 mg/dL (ref 0.2–1.2)
Total Protein: 7.1 g/dL (ref 6.1–8.1)

## 2020-01-28 LAB — CBC WITH DIFFERENTIAL/PLATELET
Absolute Monocytes: 603 cells/uL (ref 200–950)
Basophils Absolute: 72 cells/uL (ref 0–200)
Basophils Relative: 0.8 %
Eosinophils Absolute: 360 cells/uL (ref 15–500)
Eosinophils Relative: 4 %
HCT: 38.8 % (ref 35.0–45.0)
Hemoglobin: 12.7 g/dL (ref 11.7–15.5)
Lymphs Abs: 2610 cells/uL (ref 850–3900)
MCH: 27.9 pg (ref 27.0–33.0)
MCHC: 32.7 g/dL (ref 32.0–36.0)
MCV: 85.3 fL (ref 80.0–100.0)
MPV: 10.5 fL (ref 7.5–12.5)
Monocytes Relative: 6.7 %
Neutro Abs: 5355 cells/uL (ref 1500–7800)
Neutrophils Relative %: 59.5 %
Platelets: 419 10*3/uL — ABNORMAL HIGH (ref 140–400)
RBC: 4.55 10*6/uL (ref 3.80–5.10)
RDW: 14.4 % (ref 11.0–15.0)
Total Lymphocyte: 29 %
WBC: 9 10*3/uL (ref 3.8–10.8)

## 2020-01-28 LAB — IRON,TIBC AND FERRITIN PANEL
%SAT: 12 % (calc) — ABNORMAL LOW (ref 16–45)
Ferritin: 18 ng/mL (ref 16–232)
Iron: 50 ug/dL (ref 40–190)
TIBC: 415 mcg/dL (calc) (ref 250–450)

## 2020-01-28 LAB — CERVICOVAGINAL ANCILLARY ONLY
Bacterial Vaginitis (gardnerella): POSITIVE — AB
Candida Glabrata: NEGATIVE
Candida Vaginitis: NEGATIVE
Chlamydia: NEGATIVE
Comment: NEGATIVE
Comment: NEGATIVE
Comment: NEGATIVE
Comment: NEGATIVE
Comment: NEGATIVE
Comment: NORMAL
Neisseria Gonorrhea: NEGATIVE
Trichomonas: NEGATIVE

## 2020-01-28 LAB — LIPID PANEL
Cholesterol: 187 mg/dL (ref <200)
HDL: 68 mg/dL (ref >=50)
LDL Cholesterol (Calc): 90 mg/dL (calc)
Non-HDL Cholesterol (Calc): 119 mg/dL (calc) (ref <130)
Total CHOL/HDL Ratio: 2.8 (calc) (ref <5.0)
Triglycerides: 195 mg/dL — ABNORMAL HIGH (ref <150)

## 2020-01-28 LAB — TSH: TSH: 1.17 mIU/L

## 2020-01-28 LAB — RPR: RPR Ser Ql: NONREACTIVE

## 2020-01-28 LAB — VITAMIN B12: Vitamin B-12: 615 pg/mL (ref 200–1100)

## 2020-01-28 LAB — VITAMIN D 25 HYDROXY (VIT D DEFICIENCY, FRACTURES): Vit D, 25-Hydroxy: 13 ng/mL — ABNORMAL LOW (ref 30–100)

## 2020-01-28 LAB — HEPATITIS C ANTIBODY
Hepatitis C Ab: NONREACTIVE
SIGNAL TO CUT-OFF: 0.01 (ref <1.00)

## 2020-01-28 LAB — HIV ANTIBODY (ROUTINE TESTING W REFLEX): HIV 1&2 Ab, 4th Generation: NONREACTIVE

## 2020-01-28 LAB — HEMOGLOBIN A1C
Hgb A1c MFr Bld: 5.6 % of total Hgb (ref <5.7)
Mean Plasma Glucose: 114 (calc)
eAG (mmol/L): 6.3 (calc)

## 2020-01-29 LAB — CYTOLOGY - PAP: Diagnosis: NEGATIVE

## 2020-01-31 ENCOUNTER — Other Ambulatory Visit: Payer: Self-pay | Admitting: Family Medicine

## 2020-01-31 MED ORDER — METRONIDAZOLE 500 MG PO TABS: 500.0000 mg | ORAL_TABLET | Freq: Two times a day (BID) | ORAL | 0 refills | Status: DC

## 2020-02-01 MED ORDER — VITAMIN D (ERGOCALCIFEROL) 1.25 MG (50000 UNIT) PO CAPS: 50000.0000 [IU] | ORAL_CAPSULE | ORAL | 1 refills | Status: DC

## 2020-02-01 NOTE — Addendum Note (Signed)
Addended by: Agnes Lawrence on: 02/01/2020 01:16 PM   Modules accepted: Orders

## 2020-02-07 ENCOUNTER — Ambulatory Visit (INDEPENDENT_AMBULATORY_CARE_PROVIDER_SITE_OTHER): Payer: BC Managed Care – PPO | Admitting: Podiatry

## 2020-02-07 ENCOUNTER — Other Ambulatory Visit: Payer: Self-pay

## 2020-02-07 DIAGNOSIS — M722 Plantar fascial fibromatosis: Secondary | ICD-10-CM

## 2020-02-13 NOTE — Progress Notes (Signed)
Subjective: Diamond Hansen is a 44 y.o. is seen today in office s/p left foot plantar fibroma excision and right plantar fibroma steroid injection. She states that she is feeling great and not having any issues. She is back to wearing a regular shoe. She has not yet returned to work but is scheduled to go back and she feels that she is ready. Denies any systemic complaints such as fevers, chills, nausea, vomiting. No calf pain, chest pain, shortness of breath.   Objective: General: No acute distress, AAOx3  DP/PT pulses palpable 2/4, CRT < 3 sec to all digits.  Protective sensation intact. Motor function intact.  LEFT foot: Incision is well coapted without any evidence of dehiscence and the incision is healing well and a scar is well formed. There is slight tenderness on the incision but overall improved. There was one area of thicker scar but the scar is flat today and healing.  No pain to the fibroma on the right foot.  No other open lesions or pre-ulcerative lesions.  No pain with calf compression, swelling, warmth, erythema.   Assessment and Plan:  Status post plantar fibroma excision, doing well with no complications   -Treatment options discussed including all alternatives, risks, and complications -Encouraged her to continue to moisturizer the scar and continue with home stretching/rehab exercises. Continue with regular shoegear. She is scheduled to return to work. At this point I am going to discharge her from the postop care and she is in agreement. If there are any issues or questions to let me know.  Trula Slade DPM

## 2020-02-18 ENCOUNTER — Other Ambulatory Visit: Payer: Self-pay | Admitting: Family Medicine

## 2020-02-18 DIAGNOSIS — F419 Anxiety disorder, unspecified: Secondary | ICD-10-CM

## 2020-03-29 ENCOUNTER — Other Ambulatory Visit: Payer: Self-pay | Admitting: Family Medicine

## 2020-03-29 DIAGNOSIS — F419 Anxiety disorder, unspecified: Secondary | ICD-10-CM

## 2020-06-13 ENCOUNTER — Other Ambulatory Visit: Payer: BC Managed Care – PPO

## 2020-07-07 ENCOUNTER — Telehealth: Payer: Self-pay | Admitting: Family Medicine

## 2020-07-07 ENCOUNTER — Other Ambulatory Visit: Payer: Self-pay | Admitting: Family Medicine

## 2020-07-07 MED ORDER — METRONIDAZOLE 500 MG PO TABS: 500.0000 mg | ORAL_TABLET | Freq: Two times a day (BID) | ORAL | 0 refills | Status: AC

## 2020-07-07 NOTE — Telephone Encounter (Signed)
Left a detailed message at the pts cell number with the information below. 

## 2020-07-07 NOTE — Telephone Encounter (Signed)
I sent tablet flagyl. If she needs something different let me know. If sx do not completely resolve with treatment she needs visit.

## 2020-07-07 NOTE — Telephone Encounter (Signed)
Patient called wanting to know if Dr. Ethlyn Hansen can call something for her BV since Dr. Ethlyn Hansen treats her for her BV. She is having lower back pian  And a little bit of blood when using the bathroom.  She doesn't want to see another provider if she needs an appointment.  Please advise

## 2020-11-18 ENCOUNTER — Other Ambulatory Visit: Payer: Self-pay

## 2020-11-18 IMAGING — US US PLC BREAST LOC DEV 1ST LESION INC US GUIDE*L*
1 series · 8 of 8 positions shown · non-contrast
Comparison: Previous exam(s).

CLINICAL DATA: Patient presents for ultrasound-guided core biopsy
of 2 sites in the LEFT breast following previous ultrasound-guided
core biopsy which demonstrated complex sclerosing lesion in the 9
o'clock location. A second site with similar ultrasound features is
adjacent to the biopsied lesion and is also localized today.

EXAM:
ULTRASOUND GUIDED RADIOACTIVE SEED LOCALIZATION OF THE LEFT BREAST
x2 sites

[Series 1: us plc breast loc dev 1st lesion inc us guide*left · 0.07mm/px · 8 of 8 slices shown]
[im 1/8]
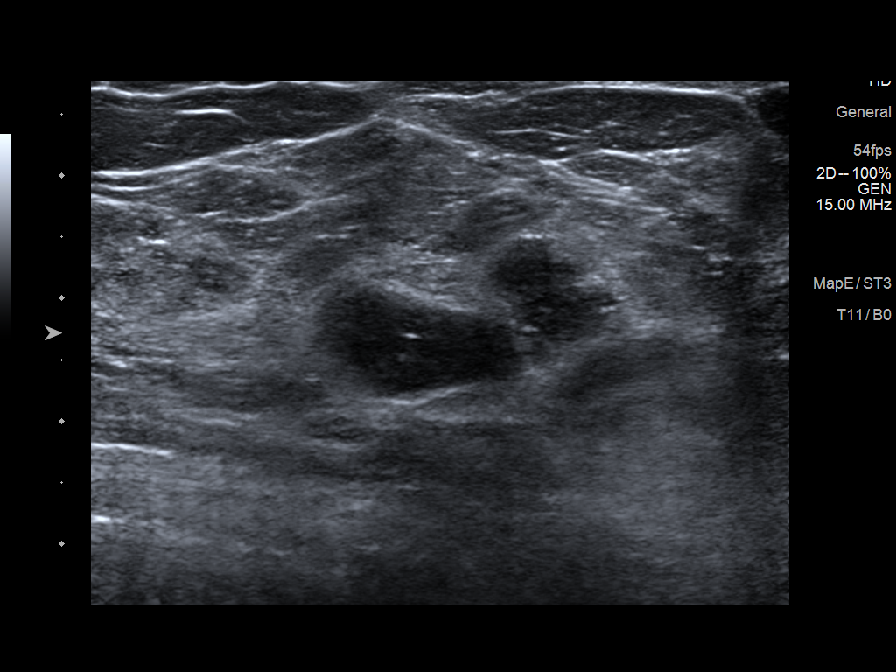
[im 2/8]
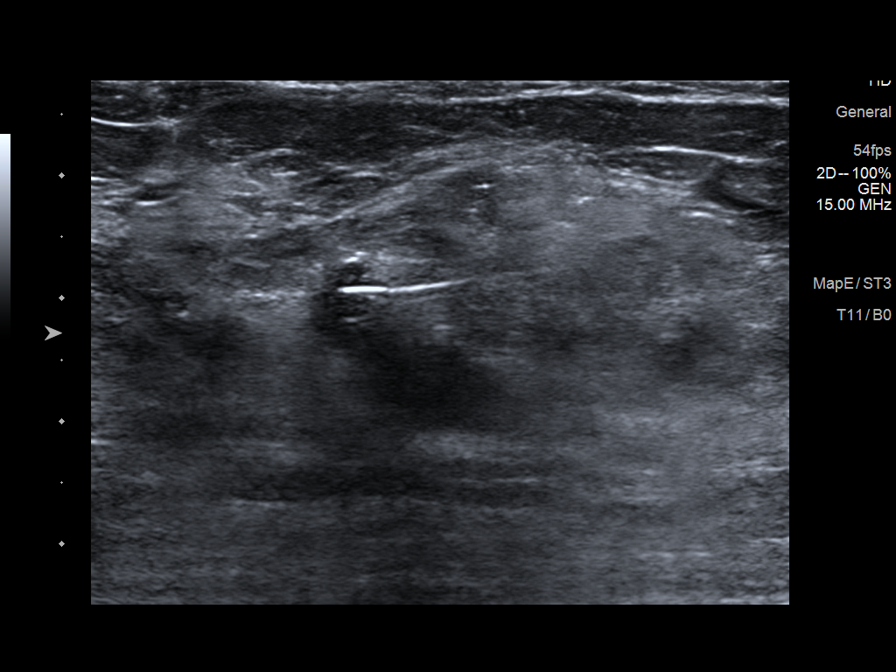
[im 3/8]
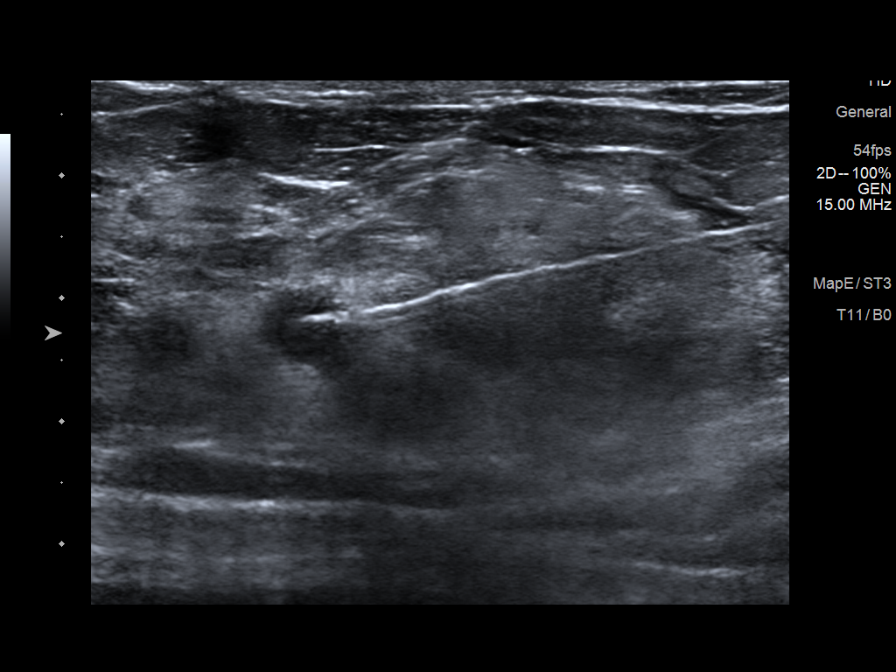
[im 4/8]
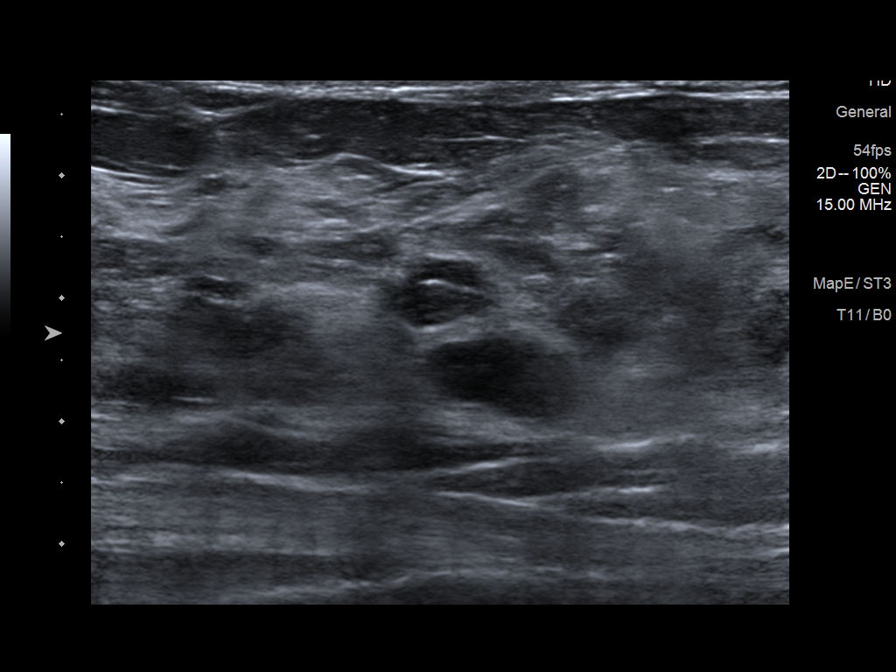
[im 5/8]
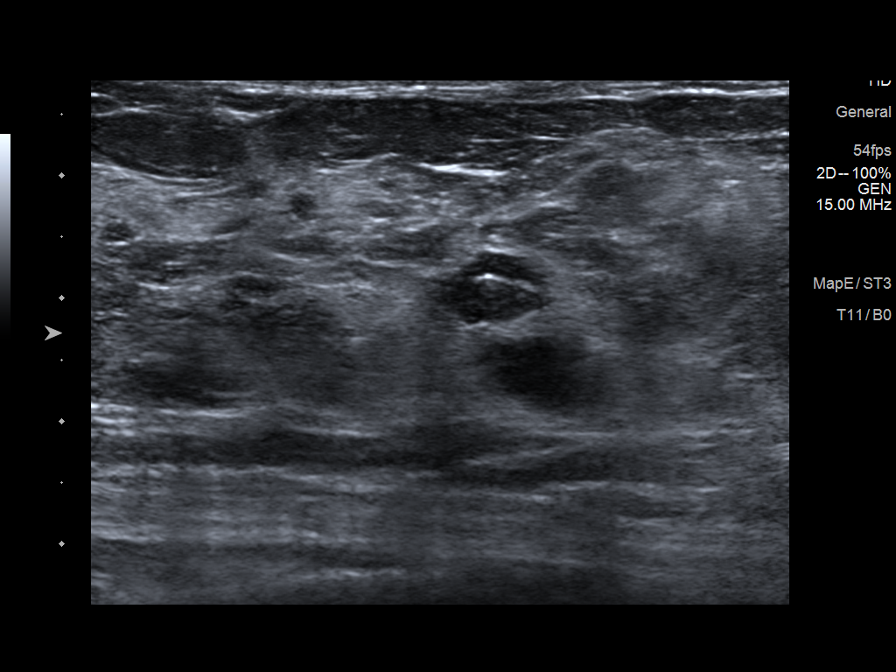
[im 6/8]
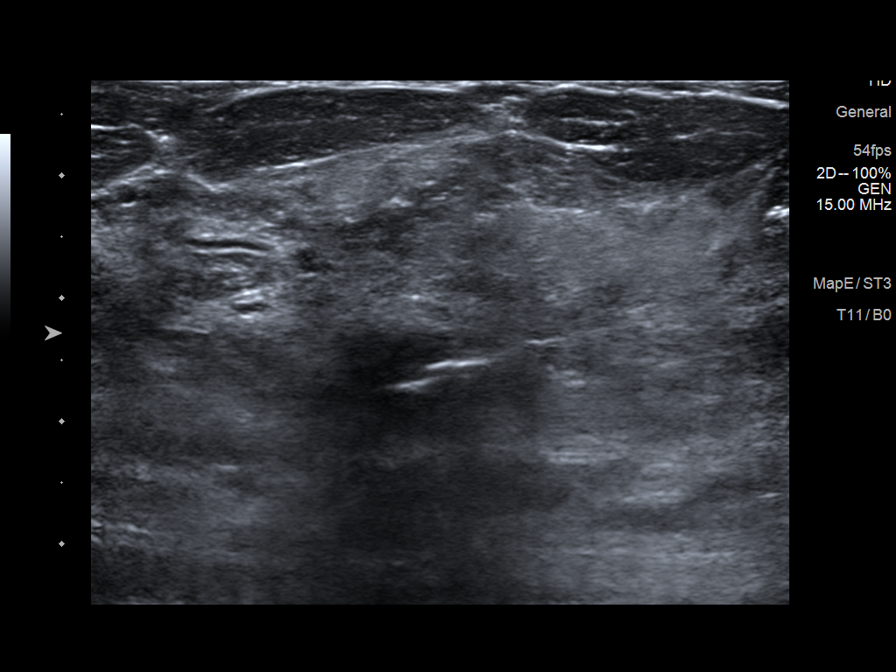
[im 7/8]
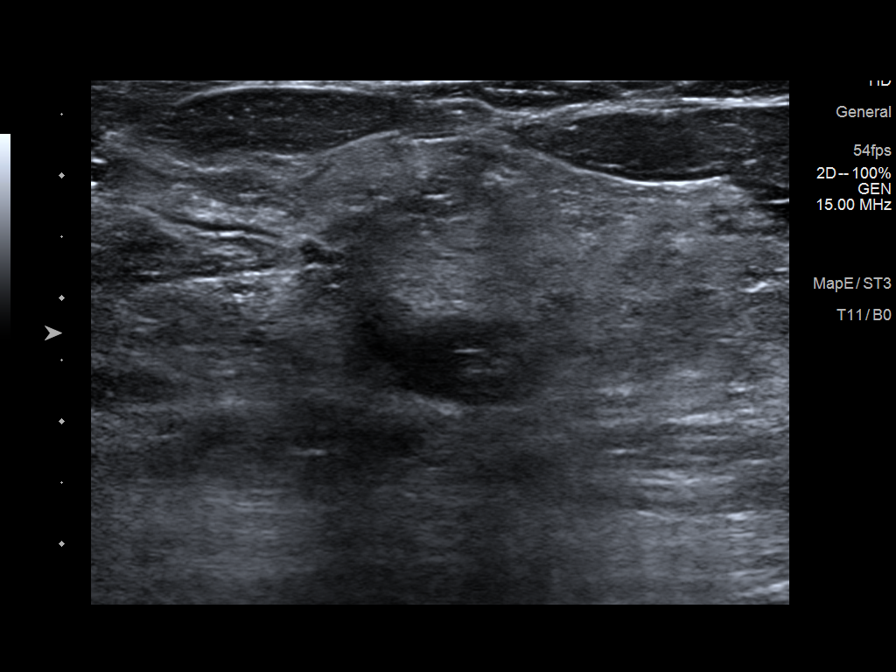
[im 8/8]
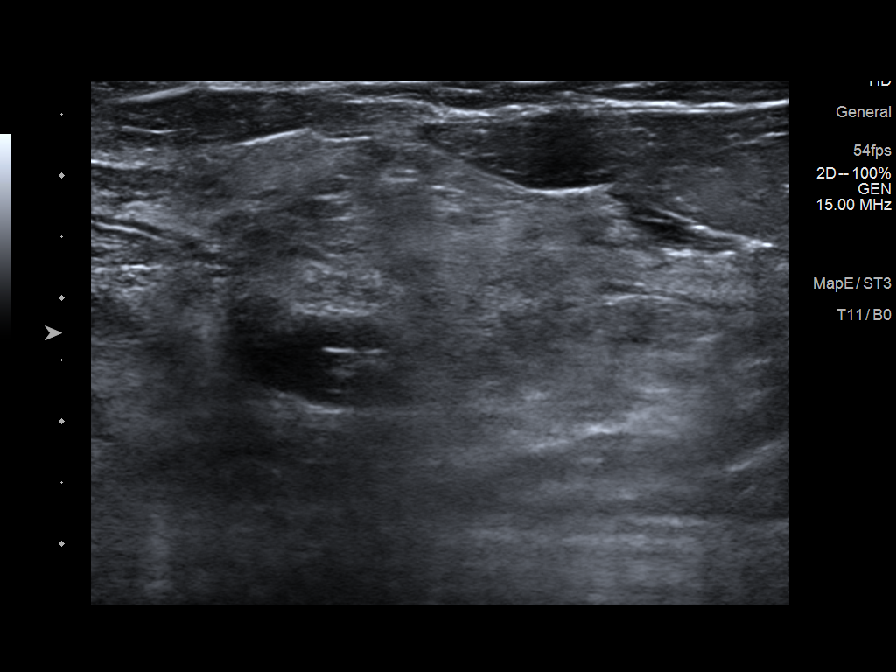

[8 of 8 positions shown; findings below may reference images not displayed]

FINDINGS: Patient presents for radioactive seed localization prior to
lumpectomy. I met with the patient and we discussed the procedure of
seed localization including benefits and alternatives. We discussed
the high likelihood of a successful procedure. We discussed the
risks of the procedure including infection, bleeding, tissue injury
and further surgery. We discussed the low dose of radioactivity
involved in the procedure. Informed, written consent was given.

The usual time-out protocol was performed immediately prior to the
procedure.

Using ultrasound guidance, sterile technique, 1% lidocaine and an
[id number]-[id number] radioactive seed, the larger of the adjacent lesions in the 9
o'clock location of the LEFT breast was localized using a inferior
to superior approach.

Order number of H-H37 seed:  [id number].

Total activity:  0.257 millicuries reference Date: 03/05/2019

Using similar technique, the smaller of the 2 adjacent lesions in
the 9 o'clock location of the LEFT breast was localized using an
inferior to superior approach.

Order number of H-H37 seed:  [id number].

Total activity:  0.257 millicuries reference Date: 03/05/2019

The follow-up mammogram images confirm the seed in the expected
location and were marked for Dr. [place].

Follow-up survey of the patient confirms presence of the radioactive
seeds.

The patient tolerated the procedure well and was released from the
[REDACTED]. She was given instructions regarding seed removal.
IMPRESSION: Radioactive seed localization of 2 adjacent sites in the LEFT
breast. No apparent complications.

## 2020-11-19 ENCOUNTER — Telehealth: Payer: Self-pay | Admitting: Family Medicine

## 2020-11-19 ENCOUNTER — Ambulatory Visit: Payer: BC Managed Care – PPO | Admitting: Family Medicine

## 2020-11-19 NOTE — Telephone Encounter (Signed)
You can add her in at 3:30 on 6/22

## 2020-11-19 NOTE — Telephone Encounter (Signed)
LMVM for the patient to contact the office to reschedule her appointment with Dr. Ethlyn Gallery on 06/22 at 3:30PM from 07/06

## 2020-11-19 NOTE — Telephone Encounter (Signed)
The patient had an appointment scheduled today at 3 but she is not getting out of work till after 3 today and had to reschedule. I scheduled her in your next available in office and that not till 07/06. Is it possible to get her worked in to a sooner appointment at 3 or after?

## 2020-12-10 ENCOUNTER — Ambulatory Visit: Payer: BC Managed Care – PPO | Admitting: Family Medicine

## 2021-01-07 ENCOUNTER — Ambulatory Visit (INDEPENDENT_AMBULATORY_CARE_PROVIDER_SITE_OTHER): Payer: BC Managed Care – PPO | Admitting: Family Medicine

## 2021-01-07 ENCOUNTER — Encounter: Payer: Self-pay | Admitting: Family Medicine

## 2021-01-07 ENCOUNTER — Other Ambulatory Visit: Payer: Self-pay

## 2021-01-07 VITALS — BP 120/90 | HR 81 | Temp 99.5°F | Ht 67.0 in | Wt 196.4 lb

## 2021-01-07 DIAGNOSIS — E559 Vitamin D deficiency, unspecified: Secondary | ICD-10-CM

## 2021-01-07 DIAGNOSIS — N920 Excessive and frequent menstruation with regular cycle: Secondary | ICD-10-CM | POA: Diagnosis not present

## 2021-01-07 DIAGNOSIS — N644 Mastodynia: Secondary | ICD-10-CM

## 2021-01-07 DIAGNOSIS — R739 Hyperglycemia, unspecified: Secondary | ICD-10-CM | POA: Diagnosis not present

## 2021-01-07 DIAGNOSIS — Z13220 Encounter for screening for lipoid disorders: Secondary | ICD-10-CM

## 2021-01-07 DIAGNOSIS — E876 Hypokalemia: Secondary | ICD-10-CM

## 2021-01-07 DIAGNOSIS — E611 Iron deficiency: Secondary | ICD-10-CM | POA: Diagnosis not present

## 2021-01-07 DIAGNOSIS — Z1231 Encounter for screening mammogram for malignant neoplasm of breast: Secondary | ICD-10-CM

## 2021-01-07 LAB — COMPREHENSIVE METABOLIC PANEL
ALT: 9 U/L (ref 0–35)
AST: 10 U/L (ref 0–37)
Albumin: 4.2 g/dL (ref 3.5–5.2)
Alkaline Phosphatase: 65 U/L (ref 39–117)
BUN: 10 mg/dL (ref 6–23)
CO2: 23 mEq/L (ref 19–32)
Calcium: 9 mg/dL (ref 8.4–10.5)
Chloride: 102 mEq/L (ref 96–112)
Creatinine, Ser: 0.74 mg/dL (ref 0.40–1.20)
GFR: 99.38 mL/min (ref >60.00)
Glucose, Bld: 86 mg/dL (ref 70–99)
Potassium: 3.2 mEq/L — ABNORMAL LOW (ref 3.5–5.1)
Sodium: 136 mEq/L (ref 135–145)
Total Bilirubin: 0.2 mg/dL (ref 0.2–1.2)
Total Protein: 7.3 g/dL (ref 6.0–8.3)

## 2021-01-07 LAB — CBC WITH DIFFERENTIAL/PLATELET
Basophils Absolute: 0 10*3/uL (ref 0.0–0.1)
Basophils Relative: 0.6 % (ref 0.0–3.0)
Eosinophils Absolute: 0.2 10*3/uL (ref 0.0–0.7)
Eosinophils Relative: 2 % (ref 0.0–5.0)
HCT: 35.4 % — ABNORMAL LOW (ref 36.0–46.0)
Hemoglobin: 11.1 g/dL — ABNORMAL LOW (ref 12.0–15.0)
Lymphocytes Relative: 32 % (ref 12.0–46.0)
Lymphs Abs: 2.4 10*3/uL (ref 0.7–4.0)
MCHC: 31.4 g/dL (ref 30.0–36.0)
MCV: 80.5 fl (ref 78.0–100.0)
Monocytes Absolute: 0.5 10*3/uL (ref 0.1–1.0)
Monocytes Relative: 6.5 % (ref 3.0–12.0)
Neutro Abs: 4.4 10*3/uL (ref 1.4–7.7)
Neutrophils Relative %: 58.9 % (ref 43.0–77.0)
Platelets: 334 10*3/uL (ref 150.0–400.0)
RBC: 4.4 Mil/uL (ref 3.87–5.11)
RDW: 17.3 % — ABNORMAL HIGH (ref 11.5–15.5)
WBC: 7.5 10*3/uL (ref 4.0–10.5)

## 2021-01-07 LAB — HEMOGLOBIN A1C: Hgb A1c MFr Bld: 5.8 % (ref 4.6–6.5)

## 2021-01-07 LAB — VITAMIN D 25 HYDROXY (VIT D DEFICIENCY, FRACTURES): VITD: 27.22 ng/mL — ABNORMAL LOW (ref 30.00–100.00)

## 2021-01-07 LAB — LIPID PANEL
Cholesterol: 148 mg/dL (ref 0–200)
HDL: 64.7 mg/dL (ref >39.00)
LDL Cholesterol: 67 mg/dL (ref 0–99)
NonHDL: 82.88
Total CHOL/HDL Ratio: 2
Triglycerides: 79 mg/dL (ref 0.0–149.0)
VLDL: 15.8 mg/dL (ref 0.0–40.0)

## 2021-01-07 LAB — TSH: TSH: 0.67 u[IU]/mL (ref 0.35–5.50)

## 2021-01-07 LAB — FERRITIN: Ferritin: 10.5 ng/mL (ref 10.0–291.0)

## 2021-01-07 NOTE — Progress Notes (Signed)
Diamond Hansen DOB: 02/12/1976 Encounter date: 01/07/2021  This is a 45 y.o. female who presents with Chief Complaint  Patient presents with   Breast Problem    Patient complains of left breast pain noted after self breast exam x1 month   Dizziness    X1 month    History of present illness:  *area where she had procedure on left breast - felt sore when she showered, just wanted to see if there was something out of ordinary/weird.   *since last month on cycle - it was very heavy. Was bleeding through clothes. Was very dizzy following week but this was also after having COVID. Last 2 days spotting again. Does have fibroids, so wasn't sure if it was coming from those. Was a little tender lower abdomen. Was under a lot more stress as well last month. Does still feel dizzy; esp with standing up and moving around. Was heavy menses for 3 -4 days. Changing pads more than every hour. Never been that heavy. Does have headache, doesn't usually have these - more frontal.   Daughter left for [employer] training, daughter's father went to jail, relative was hit and killed by car - just a lot.    No Known Allergies Current Meds  Medication Sig   Ferrous Sulfate (IRON PO) Take by mouth daily.   ibuprofen (ADVIL) 600 MG tablet Take 600 mg by mouth every 6 (six) hours as needed.   Omeprazole Magnesium (PRILOSEC) 2.5 MG PACK Take by mouth daily.   sertraline (ZOLOFT) 50 MG tablet TAKE 1 TABLET(50 MG) BY MOUTH DAILY   Vitamin D, Ergocalciferol, (DRISDOL) 1.25 MG (50000 UNIT) CAPS capsule Take 1 capsule (50,000 Units total) by mouth every 7 (seven) days.    Review of Systems  Constitutional:  Negative for chills, fatigue and fever.  Respiratory:  Negative for cough, chest tightness, shortness of breath and wheezing.   Cardiovascular:  Negative for chest pain, palpitations and leg swelling.  Genitourinary:  Positive for menstrual problem.   Objective:  BP 120/90 (BP Location: Left Arm, Patient Position:  Sitting, Cuff Size: Large)   Pulse 81   Temp 99.5 F (37.5 C) (Oral)   Ht '5\' 7"'$  (1.702 m)   Wt 196 lb 6.4 oz (89.1 kg)   LMP 12/17/2020 (Approximate)   SpO2 99%   BMI 30.76 kg/m   Weight: 196 lb 6.4 oz (89.1 kg)   BP Readings from Last 3 Encounters:  01/07/21 120/90  01/25/20 130/82  09/17/19 129/82   Wt Readings from Last 3 Encounters:  01/07/21 196 lb 6.4 oz (89.1 kg)  01/25/20 209 lb 12.8 oz (95.2 kg)  06/25/19 187 lb 4 oz (84.9 kg)    Physical Exam Constitutional:      General: She is not in acute distress.    Appearance: She is well-developed.  Cardiovascular:     Rate and Rhythm: Normal rate and regular rhythm.     Heart sounds: Normal heart sounds. No murmur heard.   No friction rub.  Pulmonary:     Effort: Pulmonary effort is normal. No respiratory distress.     Breath sounds: Normal breath sounds. No wheezing or rales.  Chest:  Breasts:    Right: Normal. No swelling, inverted nipple, mass, nipple discharge, skin change, tenderness, axillary adenopathy or supraclavicular adenopathy.     Left: Tenderness present. No swelling, inverted nipple, mass, nipple discharge, skin change, axillary adenopathy or supraclavicular adenopathy.     Comments: Generalized tenderness left breast without palpable abnormality.  Abdominal:     General: Bowel sounds are normal.     Tenderness: There is abdominal tenderness (lower).     Comments: Tenderness with palpation lower abd; fundus uterus enlarged, tenderness to palpation (suspect fibroid).   Musculoskeletal:     Right lower leg: No edema.     Left lower leg: No edema.  Lymphadenopathy:     Upper Body:     Right upper body: No supraclavicular, axillary or pectoral adenopathy.     Left upper body: No supraclavicular, axillary or pectoral adenopathy.  Neurological:     Mental Status: She is alert and oriented to person, place, and time.  Psychiatric:        Behavior: Behavior normal.    Assessment/Plan  1. Breast  tenderness in female She is overdue for routine screening. Due to previous abnormal and tenderness now; will order diagnostic.  - [facility] Digital Diagnostic Bilat; Future  2. Menorrhagia with regular cycle Start with bloodwork; I do feel she may need [place]. - CBC with Differential/Platelet; Future - TSH; Future - Iron and TIBC; Future - Ferritin; Future - Ferritin - Iron and TIBC - TSH - CBC with Differential/Platelet  3. Lipid screening - Lipid panel; Future - Lipid panel  4. Iron deficiency Check bloodwork  5. Hyperglycemia - Comprehensive metabolic panel; Future - Hemoglobin A1c; Future - Hemoglobin A1c - Comprehensive metabolic panel  6. Vitamin D deficiency - Vitamin D, 25-hydroxy; Future - Vitamin D, 25-hydroxy  7. Encounter for screening mammogram for malignant neoplasm of breast - MM Digital Diagnostic Bilat; Future    Return for pending labs and bp report.     Micheline Rough, MD

## 2021-01-07 NOTE — Patient Instructions (Signed)
Get home pressure cuff. Automatic arm cuff - large adult. Check pressures at most twice daily and let me know the numbers in 1-2 weeks. Goal is less than 130/85.

## 2021-01-08 ENCOUNTER — Telehealth: Payer: Self-pay | Admitting: Family Medicine

## 2021-01-08 LAB — IRON AND TIBC
Iron Saturation: 7 % — CL (ref 15–55)
Iron: 30 ug/dL (ref 27–159)
Total Iron Binding Capacity: 406 ug/dL (ref 250–450)
UIBC: 376 ug/dL (ref 131–425)

## 2021-01-09 NOTE — Telephone Encounter (Signed)
PT needs a refill of their Vitamin D, Ergocalciferol, (DRISDOL) 1.25 MG (50000 UNIT) CAPS capsule. Noticed that there was msg in system and PT would like some clarification on why it was refused.

## 2021-01-09 NOTE — Telephone Encounter (Signed)
Only needs 06-1998 units otc. Doesn't need this dose currently.

## 2021-01-09 NOTE — Telephone Encounter (Signed)
Patient informed of the message below.

## 2021-01-12 ENCOUNTER — Telehealth: Payer: Self-pay | Admitting: Family Medicine

## 2021-01-12 NOTE — Addendum Note (Signed)
Addended by: Agnes Lawrence on: 01/12/2021 11:50 AM   Modules accepted: Orders

## 2021-01-12 NOTE — Telephone Encounter (Signed)
Diamond Hansen, [phone], called to inquire about the Ultrasound schedule to see if Transvaginal is needed or not.

## 2021-01-14 ENCOUNTER — Other Ambulatory Visit: Payer: Self-pay | Admitting: Family Medicine

## 2021-01-14 DIAGNOSIS — N644 Mastodynia: Secondary | ICD-10-CM

## 2021-01-14 NOTE — Telephone Encounter (Signed)
Yes for transvaginal

## 2021-01-14 NOTE — Telephone Encounter (Signed)
Spoke with Overton at the extension below and informed her of the order.

## 2021-01-16 ENCOUNTER — Other Ambulatory Visit: Payer: Self-pay

## 2021-01-16 ENCOUNTER — Ambulatory Visit
Admission: RE | Admit: 2021-01-16 | Discharge: 2021-01-16 | Disposition: A | Discharge status: Home / Self Care | Payer: BC Managed Care – PPO | Source: Ambulatory Visit | Attending: Family Medicine | Admitting: Family Medicine

## 2021-01-16 DIAGNOSIS — N920 Excessive and frequent menstruation with regular cycle: Secondary | ICD-10-CM

## 2021-01-20 ENCOUNTER — Other Ambulatory Visit: Payer: Self-pay | Admitting: *Deleted

## 2021-01-20 ENCOUNTER — Telehealth: Payer: Self-pay

## 2021-01-20 DIAGNOSIS — D219 Benign neoplasm of connective and other soft tissue, unspecified: Secondary | ICD-10-CM

## 2021-01-20 NOTE — Telephone Encounter (Signed)
See results note. 

## 2021-01-27 ENCOUNTER — Other Ambulatory Visit: Payer: BC Managed Care – PPO

## 2021-01-30 ENCOUNTER — Telehealth: Payer: Self-pay | Admitting: Family Medicine

## 2021-01-30 NOTE — Telephone Encounter (Signed)
Patient is requesting a prescription for bacteria vaginosis sent to her while she's out of town. She also stated that she will be back home tomorrow.  The [facility] (out of town) is [address] [place], [place] [address].  The original pharmacy is [facility] [id number] - [place], [place] - [address] AT [facility].  Please advise.

## 2021-01-30 NOTE — Telephone Encounter (Signed)
Left a detailed message at the patients cell number stating medications are not sent in without a visit.  Left a message stating our office does not have any openings today, even for a virtual visit and she may want to go to a local urgent care.

## 2021-02-10 ENCOUNTER — Telehealth: Payer: Self-pay | Admitting: Family Medicine

## 2021-02-10 DIAGNOSIS — F419 Anxiety disorder, unspecified: Secondary | ICD-10-CM

## 2021-02-10 MED ORDER — SERTRALINE HCL 50 MG PO TABS: ORAL_TABLET | ORAL | 1 refills | Status: DC

## 2021-02-10 NOTE — Telephone Encounter (Signed)
Pt call and stated she would like for  Diamond Hansen to give her a call and didn't stated want she wanted to talk about,

## 2021-02-10 NOTE — Telephone Encounter (Signed)
Spoke with the patient and she complains of back pain, vaginal discharge and feels this is due to recurrent BV.  Patient stated she is working out of town in [place] for the next 3 weeks, only comes home on Fridays and requested a medication be called in.  Patient also requested a refill on Sertraline.  Patient was advised a visit is needed prior to medications be called in and a virtual visit cannot be done with any providers here as she is in [facility] and she may want to see a provider there.  Patient is aware refills for Sertraline were sent to [facility] and an appt was scheduled with Dr Volanda Napoleon on 9/9 as PCP does not have any openings.

## 2021-02-13 ENCOUNTER — Ambulatory Visit: Payer: BC Managed Care – PPO | Admitting: Family Medicine

## 2021-02-20 ENCOUNTER — Other Ambulatory Visit: Payer: BC Managed Care – PPO

## 2021-03-02 ENCOUNTER — Other Ambulatory Visit (INDEPENDENT_AMBULATORY_CARE_PROVIDER_SITE_OTHER): Payer: BC Managed Care – PPO

## 2021-03-02 ENCOUNTER — Other Ambulatory Visit: Payer: Self-pay

## 2021-03-02 DIAGNOSIS — E876 Hypokalemia: Secondary | ICD-10-CM

## 2021-03-02 LAB — BASIC METABOLIC PANEL
BUN: 16 mg/dL (ref 6–23)
CO2: 26 mEq/L (ref 19–32)
Calcium: 9.2 mg/dL (ref 8.4–10.5)
Chloride: 100 mEq/L (ref 96–112)
Creatinine, Ser: 0.76 mg/dL (ref 0.40–1.20)
GFR: 96.15 mL/min (ref >60.00)
Glucose, Bld: 86 mg/dL (ref 70–99)
Potassium: 3.8 mEq/L (ref 3.5–5.1)
Sodium: 137 mEq/L (ref 135–145)

## 2021-03-06 ENCOUNTER — Other Ambulatory Visit: Payer: BC Managed Care – PPO

## 2021-04-21 ENCOUNTER — Other Ambulatory Visit: Payer: Self-pay | Admitting: Obstetrics and Gynecology

## 2021-12-21 ENCOUNTER — Ambulatory Visit (INDEPENDENT_AMBULATORY_CARE_PROVIDER_SITE_OTHER): Payer: BC Managed Care – PPO

## 2021-12-21 ENCOUNTER — Ambulatory Visit (INDEPENDENT_AMBULATORY_CARE_PROVIDER_SITE_OTHER): Payer: BC Managed Care – PPO | Admitting: Podiatry

## 2021-12-21 DIAGNOSIS — M7661 Achilles tendinitis, right leg: Secondary | ICD-10-CM | POA: Diagnosis not present

## 2021-12-21 DIAGNOSIS — M722 Plantar fascial fibromatosis: Secondary | ICD-10-CM

## 2021-12-21 DIAGNOSIS — M7731 Calcaneal spur, right foot: Secondary | ICD-10-CM | POA: Diagnosis not present

## 2021-12-21 MED ORDER — MELOXICAM 15 MG PO TABS: 15.0000 mg | ORAL_TABLET | Freq: Every day | ORAL | 0 refills | Status: DC | PRN

## 2021-12-21 NOTE — Patient Instructions (Addendum)
For shoes I like Hoka, New Balance, Brooks  Use "urea cream" on the left foot callus   For instructions on how to put on your Night Splint, please visit [email or web address]   Achilles Tendinitis  with Rehab Achilles tendinitis is a disorder of the Achilles tendon. The Achilles tendon connects the large calf muscles (Gastrocnemius and Soleus) to the heel bone (calcaneus). This tendon is sometimes called the heel cord. It is important for pushing-off and standing on your toes and is important for walking, running, or jumping. Tendinitis is often caused by overuse and repetitive microtrauma. SYMPTOMS Pain, tenderness, swelling, warmth, and redness may occur over the Achilles tendon even at rest. Pain with pushing off, or flexing or extending the ankle. Pain that is worsened after or during activity. CAUSES  Overuse sometimes seen with rapid increase in exercise programs or in sports requiring running and jumping. Poor physical conditioning (strength and flexibility or endurance). Running sports, especially training running down hills. Inadequate warm-up before practice or play or failure to stretch before participation. Injury to the tendon. PREVENTION  Warm up and stretch before practice or competition. Allow time for adequate rest and recovery between practices and competition. Keep up conditioning. Keep up ankle and leg flexibility. Improve or keep muscle strength and endurance. Improve cardiovascular fitness. Use proper technique. Use proper equipment (shoes, skates). To help prevent recurrence, taping, protective strapping, or an adhesive bandage may be recommended for several weeks after healing is complete. PROGNOSIS  Recovery may take weeks to several months to heal. Longer recovery is expected if symptoms have been prolonged. Recovery is usually quicker if the inflammation is due to a direct blow as compared with overuse or sudden strain. RELATED COMPLICATIONS  Healing  time will be prolonged if the condition is not correctly treated. The injury must be given plenty of time to heal. Symptoms can reoccur if activity is resumed too soon. Untreated, tendinitis may increase the risk of tendon rupture requiring additional time for recovery and possibly surgery. TREATMENT  The first treatment consists of rest anti-inflammatory medication, and ice to relieve the pain. Stretching and strengthening exercises after resolution of pain will likely help reduce the risk of recurrence. Referral to a physical therapist or athletic trainer for further evaluation and treatment may be helpful. A walking boot or cast may be recommended to rest the Achilles tendon. This can help break the cycle of inflammation and microtrauma. Arch supports (orthotics) may be prescribed or recommended by your caregiver as an adjunct to therapy and rest. Surgery to remove the inflamed tendon lining or degenerated tendon tissue is rarely necessary and has shown less than predictable results. MEDICATION  Nonsteroidal anti-inflammatory medications, such as aspirin and ibuprofen, may be used for pain and inflammation relief. Do not take within 7 days before surgery. Take these as directed by your caregiver. Contact your caregiver immediately if any bleeding, stomach upset, or signs of allergic reaction occur. Other minor pain relievers, such as acetaminophen, may also be used. Pain relievers may be prescribed as necessary by your caregiver. Do not take prescription pain medication for longer than 4 to 7 days. Use only as directed and only as much as you need. Cortisone injections are rarely indicated. Cortisone injections may weaken tendons and predispose to rupture. It is better to give the condition more time to heal than to use them. HEAT AND COLD Cold is used to relieve pain and reduce inflammation for acute and chronic Achilles tendinitis. Cold should be applied for 10 to  15 minutes every 2 to 3 hours for  inflammation and pain and immediately after any activity that aggravates your symptoms. Use ice packs or an ice massage. Heat may be used before performing stretching and strengthening activities prescribed by your caregiver. Use a heat pack or a warm soak. SEEK MEDICAL CARE IF: Symptoms get worse or do not improve in 2 weeks despite treatment. New, unexplained symptoms develop. Drugs used in treatment may produce side effects.  EXERCISES:  RANGE OF MOTION (ROM) AND STRETCHING EXERCISES - Achilles Tendinitis  These exercises may help you when beginning to rehabilitate your injury. Your symptoms may resolve with or without further involvement from your physician, physical therapist or athletic trainer. While completing these exercises, remember:  Restoring tissue flexibility helps normal motion to return to the joints. This allows healthier, less painful movement and activity. An effective stretch should be held for at least 30 seconds. A stretch should never be painful. You should only feel a gentle lengthening or release in the stretched tissue.  STRETCH  Gastroc, Standing  Place hands on wall. Extend right / left leg, keeping the front knee somewhat bent. Slightly point your toes inward on your back foot. Keeping your right / left heel on the floor and your knee straight, shift your weight toward the wall, not allowing your back to arch. You should feel a gentle stretch in the right / left calf. Hold this position for 10 seconds. Repeat 3 times. Complete this stretch 2 times per day.  STRETCH  Soleus, Standing  Place hands on wall. Extend right / left leg, keeping the other knee somewhat bent. Slightly point your toes inward on your back foot. Keep your right / left heel on the floor, bend your back knee, and slightly shift your weight over the back leg so that you feel a gentle stretch deep in your back calf. Hold this position for 10 seconds. Repeat 3 times. Complete this stretch 2  times per day.  STRETCH  Gastrocsoleus, Standing  Note: This exercise can place a lot of stress on your foot and ankle. Please complete this exercise only if specifically instructed by your caregiver.  Place the ball of your right / left foot on a step, keeping your other foot firmly on the same step. Hold on to the wall or a rail for balance. Slowly lift your other foot, allowing your body weight to press your heel down over the edge of the step. You should feel a stretch in your right / left calf. Hold this position for 10 seconds. Repeat this exercise with a slight bend in your knee. Repeat 3 times. Complete this stretch 2 times per day.   STRENGTHENING EXERCISES - Achilles Tendinitis These exercises may help you when beginning to rehabilitate your injury. They may resolve your symptoms with or without further involvement from your physician, physical therapist or athletic trainer. While completing these exercises, remember:  Muscles can gain both the endurance and the strength needed for everyday activities through controlled exercises. Complete these exercises as instructed by your physician, physical therapist or athletic trainer. Progress the resistance and repetitions only as guided. You may experience muscle soreness or fatigue, but the pain or discomfort you are trying to eliminate should never worsen during these exercises. If this pain does worsen, stop and make certain you are following the directions exactly. If the pain is still present after adjustments, discontinue the exercise until you can discuss the trouble with your clinician.  STRENGTH - Plantar-flexors  Sit with your right / left leg extended. Holding onto both ends of a rubber exercise band/tubing, loop it around the ball of your foot. Keep a slight tension in the band. Slowly push your toes away from you, pointing them downward. Hold this position for 10 seconds. Return slowly, controlling the tension in the  band/tubing. Repeat 3 times. Complete this exercise 2 times per day.   STRENGTH - Plantar-flexors  Stand with your feet shoulder width apart. Steady yourself with a wall or table using as little support as needed. Keeping your weight evenly spread over the width of your feet, rise up on your toes.* Hold this position for 10 seconds. Repeat 3 times. Complete this exercise 2 times per day.  *If this is too easy, shift your weight toward your right / left leg until you feel challenged. Ultimately, you may be asked to do this exercise with your right / left foot only.  STRENGTH  Plantar-flexors, Eccentric  Note: This exercise can place a lot of stress on your foot and ankle. Please complete this exercise only if specifically instructed by your caregiver.  Place the balls of your feet on a step. With your hands, use only enough support from a wall or rail to keep your balance. Keep your knees straight and rise up on your toes. Slowly shift your weight entirely to your right / left toes and pick up your opposite foot. Gently and with controlled movement, lower your weight through your right / left foot so that your heel drops below the level of the step. You will feel a slight stretch in the back of your calf at the end position. Use the healthy leg to help rise up onto the balls of both feet, then lower weight only on the right / left leg again. Build up to 15 repetitions. Then progress to 3 consecutive sets of 15 repetitions.* After completing the above exercise, complete the same exercise with a slight knee bend (about 30 degrees). Again, build up to 15 repetitions. Then progress to 3 consecutive sets of 15 repetitions.* Perform this exercise 2 times per day.  *When you easily complete 3 sets of 15, your physician, physical therapist or athletic trainer may advise you to add resistance by wearing a backpack filled with additional weight.  STRENGTH - Plantar Flexors, Seated  Sit on a chair that  allows your feet to rest flat on the ground. If necessary, sit at the edge of the chair. Keeping your toes firmly on the ground, lift your right / left heel as far as you can without increasing any discomfort in your ankle. Repeat 3 times. Complete this exercise 2 times a day.

## 2021-12-21 NOTE — Progress Notes (Signed)
Subjective: On the right midfoot the mass is coming back. She has had a steroid injetion before which has done well. She also gets pain in the back on the right heel after sitting and has to get back 'in motion' before it starts to loosen up. She also gets pain at night when sitting.  No recent injuries that she reports.  No recent treatment.   Works [employer]   Objective: AAO x3, NAD DP/PT pulses palpable bilaterally, CRT less than 3 seconds Tenderness palpation area of prominent bone spur on the right posterior heel.  No pain on the Achilles tendon except along insertion.  Achilles tendon appears to be intact.  No edema, erythema.  No pain with lateral compression of calcaneus.  No other areas of pinpoint tenderness. Callus left arch along incision without any underlying ulceration drainage or signs of infection. Small knot on the right midfoot which appears to be clinically plantar fibroma.  This is not causing any pain. No pain with calf compression, swelling, warmth, erythema  Assessment: Right posterior heel spur, insertional Achilles tendinitis; plantar fibroma; callus along scar left foot  Plan: -All treatment options discussed with the patient including all alternatives, risks, complications.  -X-ray obtained reviewed of the RIGHT foot.  3 views were obtained.  Calcaneal spurring is present.  No evidence of acute fracture.  No sign calcifications -Night splint dispensed to help stretch the Achilles tendon.  Dispensed heel lift.  Discussed shoe modifications good arch support. -As a courtesy debride the callus on left foot with any complications or bleeding. -We discussed steroid injection but ultimately does not continue with stretching as well as using topical Voltaren that she can use and rub in the area as well to help break up the plantar fibroma.  If no improvement then we will proceed with this injection. -Patient encouraged to call the office with any questions, concerns, change  in symptoms.   Trula Slade DPM

## 2021-12-25 ENCOUNTER — Ambulatory Visit: Payer: BC Managed Care – PPO | Admitting: Family

## 2022-01-20 ENCOUNTER — Other Ambulatory Visit: Payer: Self-pay | Admitting: Podiatry

## 2022-01-20 NOTE — Telephone Encounter (Signed)
Patient called in she did not request this refill, she still has some of her original prescription left.

## 2022-02-18 DIAGNOSIS — R8279 Other abnormal findings on microbiological examination of urine: Secondary | ICD-10-CM | POA: Diagnosis not present

## 2022-02-18 DIAGNOSIS — R102 Pelvic and perineal pain: Secondary | ICD-10-CM | POA: Diagnosis not present

## 2022-02-18 DIAGNOSIS — D259 Leiomyoma of uterus, unspecified: Secondary | ICD-10-CM | POA: Diagnosis not present

## 2022-02-18 DIAGNOSIS — N946 Dysmenorrhea, unspecified: Secondary | ICD-10-CM | POA: Diagnosis not present

## 2022-05-03 DIAGNOSIS — Z1231 Encounter for screening mammogram for malignant neoplasm of breast: Secondary | ICD-10-CM | POA: Diagnosis not present

## 2022-05-03 LAB — HM MAMMOGRAPHY

## 2022-05-04 DIAGNOSIS — Z124 Encounter for screening for malignant neoplasm of cervix: Secondary | ICD-10-CM | POA: Diagnosis not present

## 2022-05-04 DIAGNOSIS — Z113 Encounter for screening for infections with a predominantly sexual mode of transmission: Secondary | ICD-10-CM | POA: Diagnosis not present

## 2022-05-04 DIAGNOSIS — Z01419 Encounter for gynecological examination (general) (routine) without abnormal findings: Secondary | ICD-10-CM | POA: Diagnosis not present

## 2022-05-10 LAB — HM PAP SMEAR: HM Pap smear: NEGATIVE

## 2022-07-17 DIAGNOSIS — U071 COVID-19: Secondary | ICD-10-CM | POA: Diagnosis not present

## 2022-07-17 DIAGNOSIS — M791 Myalgia, unspecified site: Secondary | ICD-10-CM | POA: Diagnosis not present

## 2022-08-26 DIAGNOSIS — Z113 Encounter for screening for infections with a predominantly sexual mode of transmission: Secondary | ICD-10-CM | POA: Diagnosis not present

## 2022-08-26 DIAGNOSIS — B3731 Acute candidiasis of vulva and vagina: Secondary | ICD-10-CM | POA: Diagnosis not present

## 2022-09-02 DIAGNOSIS — S61215A Laceration without foreign body of left ring finger without damage to nail, initial encounter: Secondary | ICD-10-CM | POA: Diagnosis not present

## 2022-09-18 DIAGNOSIS — Z4802 Encounter for removal of sutures: Secondary | ICD-10-CM | POA: Diagnosis not present

## 2023-01-20 ENCOUNTER — Encounter (INDEPENDENT_AMBULATORY_CARE_PROVIDER_SITE_OTHER): Payer: Self-pay

## 2023-01-21 ENCOUNTER — Ambulatory Visit: Payer: BC Managed Care – PPO | Admitting: Podiatry

## 2023-02-15 ENCOUNTER — Ambulatory Visit (INDEPENDENT_AMBULATORY_CARE_PROVIDER_SITE_OTHER): Payer: BC Managed Care – PPO | Admitting: Podiatry

## 2023-02-15 ENCOUNTER — Ambulatory Visit (INDEPENDENT_AMBULATORY_CARE_PROVIDER_SITE_OTHER): Payer: BC Managed Care – PPO

## 2023-02-15 DIAGNOSIS — M7661 Achilles tendinitis, right leg: Secondary | ICD-10-CM

## 2023-02-15 DIAGNOSIS — M7731 Calcaneal spur, right foot: Secondary | ICD-10-CM | POA: Diagnosis not present

## 2023-02-15 MED ORDER — AMMONIUM LACTATE 12 % EX CREA: 1.0000 | TOPICAL_CREAM | CUTANEOUS | 0 refills | Status: AC | PRN

## 2023-02-15 MED ORDER — MELOXICAM 7.5 MG PO TABS: 7.5000 mg | ORAL_TABLET | Freq: Every day | ORAL | 0 refills | Status: DC | PRN

## 2023-02-15 NOTE — Patient Instructions (Signed)

## 2023-02-15 NOTE — Progress Notes (Signed)
Subjective: No chief complaint on file.   47 year old female presents the office today with concerns of pain to the back of her right heel.  She states that she works third shift and she is on her feet a lot she tried to get pain again to the back of the heel.  She had this previously.  No recent injuries to her foot.  She also states that she gets a sharp pain point to her back and sometimes will radiate down her leg.  She has not had any treatment for this.  Objective: AAO x3, NAD DP/PT pulses palpable bilaterally, CRT less than 3 seconds On the plantar aspect of the left foot on the area the previous surgery scar is formed and there is mild callus formation.  She gets some tenderness palpation this area.  No edema, erythema. There is tenderness palpation on the distal portion of the Achilles tendon along insertion of the Achilles into the calcaneus and a small spur is prominent to the heel.  Achilles tendon appears to be intact.  There is no edema, erythema.  There is no pain with lateral compression of the calcaneus. No pain with calf compression, swelling, warmth, erythema  Assessment: 47 year old female with a right heel spur, insertional Achilles tendinitis  Plan: -All treatment options discussed with the patient including all alternatives, risks, complications.  -X-rays were obtained and reviewed with the patient.  3 views of the ankle were obtained today.  Calcaneal spurring is present.  There is no evidence of acute fracture. -We discussed the conservative as well as surgical treatment options.  We agreed to continue with with conservative treatment.  We discussed regular stretching, icing daily.  She has a night splint already discussed regular use of this which she has not been using.  That she modifications and arch support.  Prescribed meloxicam to take as needed and discussed side effects.  Dispensed a gel Achilles sleeve. -Prescribed AmLactin for the callus on the incision on the  left foot. -Recommend follow-up with her PCP in regards to the back again. -Patient encouraged to call the office with any questions, concerns, change in symptoms.   Vivi Barrack DPM

## 2023-03-03 ENCOUNTER — Ambulatory Visit: Payer: BC Managed Care – PPO | Admitting: Family Medicine

## 2023-03-03 ENCOUNTER — Encounter: Payer: Self-pay | Admitting: Family Medicine

## 2023-03-03 VITALS — BP 146/100 | HR 63 | Temp 97.8°F | Ht 67.0 in | Wt 185.7 lb

## 2023-03-03 DIAGNOSIS — Z1211 Encounter for screening for malignant neoplasm of colon: Secondary | ICD-10-CM | POA: Diagnosis not present

## 2023-03-03 DIAGNOSIS — Z23 Encounter for immunization: Secondary | ICD-10-CM

## 2023-03-03 DIAGNOSIS — I10 Essential (primary) hypertension: Secondary | ICD-10-CM

## 2023-03-03 DIAGNOSIS — N3000 Acute cystitis without hematuria: Secondary | ICD-10-CM

## 2023-03-03 DIAGNOSIS — Z13220 Encounter for screening for lipoid disorders: Secondary | ICD-10-CM

## 2023-03-03 DIAGNOSIS — M545 Low back pain, unspecified: Secondary | ICD-10-CM

## 2023-03-03 LAB — POCT URINALYSIS DIPSTICK
Bilirubin, UA: NEGATIVE
Glucose, UA: NEGATIVE
Ketones, UA: NEGATIVE
Nitrite, UA: NEGATIVE
Protein, UA: POSITIVE — AB
Spec Grav, UA: 1.01 (ref 1.010–1.025)
Urobilinogen, UA: 0.2 E.U./dL
pH, UA: 6 (ref 5.0–8.0)

## 2023-03-03 LAB — LIPID PANEL
Cholesterol: 181 mg/dL (ref 0–200)
HDL: 83 mg/dL (ref >39.00)
LDL Cholesterol: 82 mg/dL (ref 0–99)
NonHDL: 97.68
Total CHOL/HDL Ratio: 2
Triglycerides: 80 mg/dL (ref 0.0–149.0)
VLDL: 16 mg/dL (ref 0.0–40.0)

## 2023-03-03 LAB — COMPREHENSIVE METABOLIC PANEL
ALT: 10 U/L (ref 0–35)
AST: 10 U/L (ref 0–37)
Albumin: 4.4 g/dL (ref 3.5–5.2)
Alkaline Phosphatase: 58 U/L (ref 39–117)
BUN: 9 mg/dL (ref 6–23)
CO2: 26 mEq/L (ref 19–32)
Calcium: 9.3 mg/dL (ref 8.4–10.5)
Chloride: 101 mEq/L (ref 96–112)
Creatinine, Ser: 0.84 mg/dL (ref 0.40–1.20)
GFR: 84.08 mL/min (ref >60.00)
Glucose, Bld: 87 mg/dL (ref 70–99)
Potassium: 3.6 mEq/L (ref 3.5–5.1)
Sodium: 137 mEq/L (ref 135–145)
Total Bilirubin: 0.3 mg/dL (ref 0.2–1.2)
Total Protein: 7.7 g/dL (ref 6.0–8.3)

## 2023-03-03 LAB — TSH: TSH: 1.64 u[IU]/mL (ref 0.35–5.50)

## 2023-03-03 MED ORDER — HYDROCHLOROTHIAZIDE 25 MG PO TABS
25.0000 mg | ORAL_TABLET | Freq: Every day | ORAL | 1 refills | Status: DC
Start: 2023-03-03 — End: 2023-10-13

## 2023-03-03 NOTE — Patient Instructions (Addendum)
Back brace  or lumbar support for heavy lifting at work  Use heating pad daily after your shift 20-30 minutes  Use muscle rubs like Icy Hot or Biofreeze at night with the heating pad, rub on your back every night after you use the heating pad.

## 2023-03-05 LAB — URINE CULTURE
MICRO NUMBER:: 15520581
SPECIMEN QUALITY:: ADEQUATE

## 2023-03-07 DIAGNOSIS — I10 Essential (primary) hypertension: Secondary | ICD-10-CM | POA: Insufficient documentation

## 2023-03-07 DIAGNOSIS — M545 Low back pain, unspecified: Secondary | ICD-10-CM | POA: Insufficient documentation

## 2023-03-07 MED ORDER — AMPICILLIN 500 MG PO CAPS
500.0000 mg | ORAL_CAPSULE | Freq: Four times a day (QID) | ORAL | 0 refills | Status: AC
Start: 2023-03-07 — End: 2023-03-14

## 2023-03-07 NOTE — Assessment & Plan Note (Signed)
New diagnosis, pt will be started on hydrochlorothiazide 25 mg daily, I will see her back in 3 months for BP check.

## 2023-03-07 NOTE — Assessment & Plan Note (Signed)
New problem, has been going on for about a month, likely related to the heavy lifting she is doing at work. We discussed that using the meloxicam could help with her back pain, I encouraged her to start this medication. UA today shows mild leukocytes, blood but pt is on her period currently. Will send for culture to confirm. No other signs of infection noted on exam.

## 2023-03-14 ENCOUNTER — Telehealth: Payer: Self-pay | Admitting: Family Medicine

## 2023-03-14 NOTE — Telephone Encounter (Signed)
Pt called to ask if someone could call her back to go over her labs with her? Please call Pt back at your earliest convenience.

## 2023-03-14 NOTE — Telephone Encounter (Signed)
Spoke with the patient and she stated she reviewed the test results via Mychart.  Patient stated she read about this and wanted to know what caused the group B infection, where this came from,etc.?  Message sent to PCP.

## 2023-03-15 ENCOUNTER — Encounter: Payer: Self-pay | Admitting: Physician Assistant

## 2023-03-15 NOTE — Telephone Encounter (Signed)
Group B strep can be a normal skin bacteria, often found living in the perineal/ vaginal area. It usually does not cause an infection unless it gets into the urinary tract. But good news is most antibiotics will kill this type of bacteria easily.

## 2023-03-15 NOTE — Telephone Encounter (Signed)
Patient informed of the message below.

## 2023-03-25 ENCOUNTER — Other Ambulatory Visit: Payer: Self-pay | Admitting: Podiatry

## 2023-05-09 ENCOUNTER — Ambulatory Visit (INDEPENDENT_AMBULATORY_CARE_PROVIDER_SITE_OTHER): Payer: BC Managed Care – PPO | Admitting: Family Medicine

## 2023-05-09 ENCOUNTER — Encounter: Payer: Self-pay | Admitting: Family Medicine

## 2023-05-09 VITALS — BP 120/90 | HR 80 | Temp 98.9°F | Ht 66.0 in | Wt 190.0 lb

## 2023-05-09 DIAGNOSIS — M545 Low back pain, unspecified: Secondary | ICD-10-CM | POA: Diagnosis not present

## 2023-05-09 DIAGNOSIS — Z0000 Encounter for general adult medical examination without abnormal findings: Secondary | ICD-10-CM

## 2023-05-09 DIAGNOSIS — G8929 Other chronic pain: Secondary | ICD-10-CM

## 2023-05-09 MED ORDER — MELOXICAM 7.5 MG PO TABS: 7.5000 mg | ORAL_TABLET | Freq: Every day | ORAL | 3 refills | Status: AC

## 2023-05-09 NOTE — Progress Notes (Signed)
Complete physical exam  Patient: Diamond Hansen   DOB: [birth year]   47 y.o. Female  MRN: [id number]  Subjective:    Chief Complaint  Patient presents with   Annual Exam    Diamond Hansen is a 47 y.o. female who presents today for a complete physical exam. She reports consuming a general diet. The patient has a physically strenuous job, but has no regular exercise apart from work.  She generally feels well. She reports sleeping fairly well. She does not have additional problems to discuss today.    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   Ferrous Sulfate (IRON PO) Take by mouth daily.   hydrochlorothiazide (HYDRODIURIL) 25 MG tablet Take 1 tablet (25 mg total) by mouth daily.   sertraline (ZOLOFT) 50 MG tablet TAKE 1 TABLET(50 MG) BY MOUTH DAILY   [DISCONTINUED] meloxicam (MOBIC) 7.5 MG tablet TAKE 1 TABLET(7.5 MG) BY MOUTH DAILY AS NEEDED FOR PAIN   No facility-administered medications prior to visit.    Review of Systems  HENT:  Negative for hearing loss.   Eyes:  Negative for blurred vision.   Respiratory:  Negative for shortness of breath.   Cardiovascular:  Negative for chest pain.  Gastrointestinal: Negative.   Genitourinary: Negative.   Musculoskeletal:  Negative for back pain.  Neurological:  Negative for headaches.  Psychiatric/Behavioral:  Negative for depression.   All other systems reviewed and are negative.      Objective:     BP (!) 120/90 (BP Location: Left Arm, Patient Position: Sitting, Cuff Size: Large)   Pulse 80   Temp 98.9 F (37.2 C) (Oral)   Ht 5\' 6"  (1.676 m)   Wt 190 lb (86.2 kg)   LMP 05/09/2023 (Exact Date)   SpO2 99%   BMI 30.67 kg/m    Physical Exam Vitals reviewed.  Constitutional:      Appearance: Normal appearance. She is well-groomed and normal weight.  HENT:     Right Ear: Tympanic membrane and ear canal normal.     Left Ear: Tympanic membrane and ear canal normal.     Mouth/Throat:     Mouth: Mucous membranes are moist.     Pharynx: No posterior oropharyngeal erythema.  Eyes:     Conjunctiva/sclera: Conjunctivae normal.  Neck:     Thyroid: No thyromegaly.  Cardiovascular:     Rate and Rhythm: Normal rate and regular rhythm.     Pulses: Normal pulses.     Heart sounds: S1 normal  and S2 normal.  Pulmonary:     Effort: Pulmonary effort is normal.     Breath sounds: Normal breath sounds and air entry.  Chest:     Chest wall: No mass.  Breasts:    Breasts are symmetrical.     Right: Normal.     Left: Normal.  Abdominal:     General: Abdomen is flat. Bowel sounds are normal.     Palpations: Abdomen is soft.  Musculoskeletal:     Right lower leg: No edema.     Left lower leg: No edema.  Lymphadenopathy:     Cervical: No cervical adenopathy.     Upper Body:     Right upper body: No axillary adenopathy.     Left upper body: No axillary adenopathy.  Neurological:     Mental Status: She is alert and oriented to person, place, and time. Mental status is at baseline.     Gait: Gait is intact.  Psychiatric:        Mood  and Affect: Mood and affect normal.        Speech: Speech normal.        Behavior: Behavior normal.        Judgment: Judgment normal.      No results found for any visits on 05/09/23.     Assessment & Plan:    Routine Health Maintenance and Physical Exam  Immunization History  Administered Date(s) Administered   Influenza, Seasonal, Injecte, Preservative Fre 03/03/2023   Tdap 05/16/2014    Health Maintenance  Topic Date Due   Colonoscopy  Never done   COVID-19 Vaccine (1 - 2023-24 season) Never done   DTaP/Tdap/Td (2 - Td or Tdap) 05/16/2024   Cervical Cancer Screening (HPV/Pap Cotest)  05/10/2025   INFLUENZA VACCINE  Completed   Hepatitis C Screening  Completed   HIV Screening  Completed   HPV VACCINES  Aged Out    Discussed health benefits of physical activity, and encouraged her to engage in regular exercise appropriate for her age and condition.  Chronic right-sided low back pain without sciatica -     Meloxicam; Take 1 tablet (7.5 mg total) by mouth daily.  Dispense: 30 tablet; Refill: 3  Routine general medical examination at a health care facility  Normal physical exam findings today, reviewed recent labs she had done, BP is improved with the hydrochlorothiazide once daily. Counseled patient on healthy sleep habits, handouts given on healthy eating and exercise. RTC 6 months for BP recheck, advised she try to lower salt in her diet.  Return in 6 months (on 11/07/2023).     Karie Georges, MD

## 2023-05-09 NOTE — Patient Instructions (Addendum)
Heating pad for 15-20 minutes, then put on Icy hot or Biofreeze-- you can use the meloxicam also for your back, but if you use diclofenac gel/patch you cannot use both that and the meloxicam.   Health Maintenance, Female Adopting a healthy lifestyle and getting preventive care are important in promoting health and wellness. Ask your health care provider about: The right schedule for you to have regular tests and exams. Things you can do on your own to prevent diseases and keep yourself healthy. What should I know about diet, weight, and exercise? Eat a healthy diet  Eat a diet that includes plenty of vegetables, fruits, low-fat dairy products, and lean protein. Do not eat a lot of foods that are high in solid fats, added sugars, or sodium. Maintain a healthy weight Body mass index (BMI) is used to identify weight problems. It estimates body fat based on height and weight. Your health care provider can help determine your BMI and help you achieve or maintain a healthy weight. Get regular exercise Get regular exercise. This is one of the most important things you can do for your health. Most adults should: Exercise for at least 150 minutes each week. The exercise should increase your heart rate and make you sweat (moderate-intensity exercise). Do strengthening exercises at least twice a week. This is in addition to the moderate-intensity exercise. Spend less time sitting. Even light physical activity can be beneficial. Watch cholesterol and blood lipids Have your blood tested for lipids and cholesterol at 45 years of age, then have this test every 5 years. Have your cholesterol levels checked more often if: Your lipid or cholesterol levels are high. You are older than 45 years of age. You are at high risk for heart disease. What should I know about cancer screening? Depending on your health history and family history, you may need to have cancer screening at various ages. This may include  screening for: Breast cancer. Cervical cancer. Colorectal cancer. Skin cancer. Lung cancer. What should I know about heart disease, diabetes, and high blood pressure? Blood pressure and heart disease High blood pressure causes heart disease and increases the risk of stroke. This is more likely to develop in people who have high blood pressure readings or are overweight. Have your blood pressure checked: Every 3-5 years if you are 104-81 years of age. Every year if you are 74 years old or older. Diabetes Have regular diabetes screenings. This checks your fasting blood sugar level. Have the screening done: Once every three years after age 75 if you are at a normal weight and have a low risk for diabetes. More often and at a younger age if you are overweight or have a high risk for diabetes. What should I know about preventing infection? Hepatitis B If you have a higher risk for hepatitis B, you should be screened for this virus. Talk with your health care provider to find out if you are at risk for hepatitis B infection. Hepatitis C Testing is recommended for: Everyone born from [birth year] through 1963. Anyone with known risk factors for hepatitis C. Sexually transmitted infections (STIs) Get screened for STIs, including gonorrhea and chlamydia, if: You are sexually active and are younger than 45 years of age. You are older than 45 years of age and your health care provider tells you that you are at risk for this type of infection. Your sexual activity has changed since you were last screened, and you are at increased risk for chlamydia or gonorrhea.  Ask your health care provider if you are at risk. Ask your health care provider about whether you are at high risk for HIV. Your health care provider may recommend a prescription medicine to help prevent HIV infection. If you choose to take medicine to prevent HIV, you should first get tested for HIV. You should then be tested every 3 months for as  long as you are taking the medicine. Pregnancy If you are about to stop having your period (premenopausal) and you may become pregnant, seek counseling before you get pregnant. Take 400 to 800 micrograms (mcg) of folic acid every day if you become pregnant. Ask for birth control (contraception) if you want to prevent pregnancy. Osteoporosis and menopause Osteoporosis is a disease in which the bones lose minerals and strength with aging. This can result in bone fractures. If you are 75 years old or older, or if you are at risk for osteoporosis and fractures, ask your health care provider if you should: Be screened for bone loss. Take a calcium or vitamin D supplement to lower your risk of fractures. Be given hormone replacement therapy (HRT) to treat symptoms of menopause. Follow these instructions at home: Alcohol use Do not drink alcohol if: Your health care provider tells you not to drink. You are pregnant, may be pregnant, or are planning to become pregnant. If you drink alcohol: Limit how much you have to: 0-1 drink a day. Know how much alcohol is in your drink. In the [place] one drink equals one 12 oz bottle of beer (355 mL), one 5 oz glass of wine (148 mL), or one 1 oz glass of hard liquor (44 mL). Lifestyle Do not use any products that contain nicotine or tobacco. These products include cigarettes, chewing tobacco, and vaping devices, such as e-cigarettes. If you need help quitting, ask your health care provider. Do not use street drugs. Do not share needles. Ask your health care provider for help if you need support or information about quitting drugs. General instructions Schedule regular health, dental, and eye exams. Stay current with your vaccines. Tell your health care provider if: You often feel depressed. You have ever been abused or do not feel safe at home. Summary Adopting a healthy lifestyle and getting preventive care are important in promoting health and  wellness. Follow your health care provider's instructions about healthy diet, exercising, and getting tested or screened for diseases. Follow your health care provider's instructions on monitoring your cholesterol and blood pressure. This information is not intended to replace advice given to you by your health care provider. Make sure you discuss any questions you have with your health care provider. Document Revised: 10/13/2020 Document Reviewed: 10/13/2020 Elsevier Patient Education  2024 [employer].

## 2023-06-20 ENCOUNTER — Ambulatory Visit: Payer: BC Managed Care – PPO | Admitting: Physician Assistant

## 2023-06-20 ENCOUNTER — Encounter: Payer: Self-pay | Admitting: Physician Assistant

## 2023-06-20 VITALS — BP 120/78 | HR 84 | Ht 67.0 in | Wt 188.5 lb

## 2023-06-20 DIAGNOSIS — Z1211 Encounter for screening for malignant neoplasm of colon: Secondary | ICD-10-CM

## 2023-06-20 DIAGNOSIS — K625 Hemorrhage of anus and rectum: Secondary | ICD-10-CM | POA: Diagnosis not present

## 2023-06-20 MED ORDER — SUFLAVE 178.7 G PO SOLR: 1.0000 | Freq: Once | ORAL | 0 refills | Status: AC

## 2023-06-20 NOTE — Patient Instructions (Addendum)
 You have been scheduled for a colonoscopy. Please follow written instructions given to you at your visit today.   Please pick up your prep supplies at the pharmacy within the next 1-3 days.  If you use inhalers (even only as needed), please bring them with you on the day of your procedure.  DO NOT TAKE 7 DAYS PRIOR TO TEST- Trulicity (dulaglutide) Ozempic, Wegovy (semaglutide) Mounjaro (tirzepatide) Bydureon Bcise (exanatide extended release)  DO NOT TAKE 1 DAY PRIOR TO YOUR TEST Rybelsus (semaglutide) Adlyxin (lixisenatide) Victoza (liraglutide) Byetta (exanatide) ___________________________________________________________________________  Rosine will receive your bowel preparation through [facility], which ensures the lowest copay and home delivery, with outreach via text or call from an [phone] number. Please respond promptly to avoid rescheduling. If you are interested in alternative options or have any questions please contact them at [phone]  Your Provider Has Sent Your Bowel Prep Regimen To [facility] What to expect. [facility] will contact you to verify your information and collect your copay, if applicable. Enjoy the comfort of your home while we deliver your prescription to you, free of any shipping charges. Fast, FREE delivery or shipping. [facility] accepts all major insurance benefits and applies discounts & coupons  Have additional questions? [facility]'s patient care team is always here to help.  Chat: [email or web address] Call: [phone] Email: [email or web address] NCPDP: [id number] How will we contact you? Welcome Phone call  a Welcome text and a Checkout link in a text Texts you receive from [phone] Are Not Spam.   *To set up delivery, you must complete the checkout process via link or speak to one of our patient care representatives. If we are unable to reach you, your prescription may be delayed.  If your blood pressure at your visit was 140/90  or greater, please contact your primary care physician to follow up on this.  _______________________________________________________  If you are age 49 or older, your body mass index should be between 23-30. Your Body mass index is 29.52 kg/m. If this is out of the aforementioned range listed, please consider follow up with your Primary Care Provider.  If you are age 17 or younger, your body mass index should be between 19-25. Your Body mass index is 29.52 kg/m. If this is out of the aformentioned range listed, please consider follow up with your Primary Care Provider.   ________________________________________________________  The [facility] providers would like to encourage you to use MYCHART to communicate with providers for non-urgent requests or questions.  Due to long hold times on the telephone, sending your provider a message by [facility] may be a faster and more efficient way to get a response.  Please allow 48 business hours for a response.  Please remember that this is for non-urgent requests.  _______________________________________________________   Thank you for entrusting me with your care and choosing [facility].  Amy Esterwood PA-C

## 2023-06-20 NOTE — Progress Notes (Signed)
 Subjective:    Patient ID: Dinesha Twiggs, female    DOB: [birth year], 48 y.o.   MRN: [id number]  HPI  Jarrod is a pleasant 48 year old female, new to GI today referred by Dr. Heron Michael/PCP for screening colonoscopy.  Patient has not had any prior GI evaluation. She currently denies any problems with abdominal pain or changes in bowel habits, no issues with diarrhea or constipation.  She says she has occasionally seen a scant amount of blood on the tissue after a bowel movement or a little bit of blood in the commode after a bowel movement over the past 1 to 2 years.  She has not had any associated anorectal discomfort. No family history of colon cancer that she is aware of. She does have a prescription for meloxicam  for low back pain, only takes this occasionally. Other medical problems include hypertension, anxiety, and is status post bilateral tubal ligation and remote umbilical hernia repair.  Review of Systems Pertinent positive and negative review of systems were noted in the above HPI section.  All other review of systems was otherwise negative.   Outpatient Encounter Medications as of 06/20/2023  Medication Sig   ammonium lactate  (AMLACTIN) 12 % cream Apply 1 Application topically as needed for dry skin.   Ferrous Sulfate (IRON PO) Take 1 tablet by mouth as needed.   hydrochlorothiazide  (HYDRODIURIL ) 25 MG tablet Take 1 tablet (25 mg total) by mouth daily.   meloxicam  (MOBIC ) 7.5 MG tablet Take 1 tablet (7.5 mg total) by mouth daily. (Patient taking differently: Take 7.5 mg by mouth as needed.)   PEG 3350-KCl-NaCl-NaSulf-MgSul (SUFLAVE ) 178.7 g SOLR Take 1 kit by mouth once for 1 dose.   sertraline  (ZOLOFT ) 50 MG tablet TAKE 1 TABLET(50 MG) BY MOUTH DAILY   No facility-administered encounter medications on file as of 06/20/2023.   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   Bacterial vaginitis, recurrent 01/08/2014   GERD (gastroesophageal reflux disease) 01/08/2014   Social History   Socioeconomic History   Marital status: Single    Spouse name: Not on file   Number of children: 3   Years of education: Not on file   Highest education level: Not on file  Occupational History   Not on file  Tobacco Use   Smoking status: Former   Smokeless tobacco: Never   Tobacco comments:    quit in 2011, smoke on and off for 8 years  Vaping Use   Vaping status: Some Days  Substance and Sexual Activity   Alcohol use: Yes    Comment: occassionally   Drug use: No   Sexual activity: Not Currently    Partners: Male  Other Topics Concern   Not on file  Social History Narrative   Work or School: works at [employer] Situation: lives with 3 daughter (18, 74, 11)      Spiritual Beliefs: none      Lifestyle: walks a few days per week; diet is so so            Social Drivers of Corporate Investment Banker Strain: Not on file  Food Insecurity: No Food Insecurity (02/02/2022)   Received from [facility]   Hunger Vital Sign    Worried About Running Out of Food in the Last  Year: Never true    Ran Out of Food in the Last Year: Never true  Transportation Needs: Not on file  Physical Activity: Not on file  Stress: Not on file  Social Connections: Unknown (01/19/2022)   Received from [facility]   Social Network    Social Network: Not on file  Intimate Partner Violence: Unknown (01/19/2022)   Received from [facility]   HITS    Physically Hurt: Not on file    Insult or Talk Down To: Not on file    Threaten Physical Harm: Not on file    Scream or Curse: Not on file    Ms. Merced's family history includes Cancer in her father; Diabetes in her brother, maternal grandfather, maternal grandmother, paternal grandfather, and paternal grandmother; High blood pressure in  her maternal grandfather, maternal grandmother, mother, paternal grandfather, and paternal grandmother; Hypertension in her father; Multiple sclerosis in her paternal uncle.      Objective:    Vitals:   06/20/23 0928  BP: 120/78  Pulse: 84    Physical Exam Well-developed well-nourished  AA female  in no acute distress.  Height, Weight,188 BMI 29.52  HEENT; nontraumatic normocephalic, EOMI, PE R LA, sclera anicteric. Oropharynx;not examined  Neck; supple, no JVD Cardiovascular; regular rate and rhythm with S1-S2, no murmur rub or gallop Pulmonary; Clear bilaterally Abdomen; soft, nontender, nondistended, no palpable mass or hepatosplenomegaly, bowel sounds are active Rectal;not done today  Skin; benign exam, no jaundice rash or appreciable lesions Extremities; no clubbing cyanosis or edema skin warm and dry Neuro/Psych; alert and oriented x4, grossly nonfocal mood and affect appropriate        Assessment & Plan:   #64 48 year old African-American female referred for screening colonoscopy, average risk/no family history. #2 chronic intermittent scant bright red blood on the tissue after bowel movements-present over the past 1 to 2 years likely hemorrhoidal  #3 hypertension #4.  Anxiety #5.  Status post umbilical hernia repair and bilateral tubal ligation  Plan; Patient will be scheduled for colonoscopy with Dr. Albertus in the [facility].  Procedure was discussed in detail with the patient including indications risks and benefits and she is agreeable to proceed. Further recommendations pending findings.   Tywaun Hiltner GORMAN Corti PA-C 06/20/2023   Cc: Ozell Heron HERO, MD

## 2023-06-25 NOTE — Progress Notes (Signed)
Addendum: Reviewed and agree with assessment and management plan. Kadijah Shamoon M, MD  

## 2023-06-27 ENCOUNTER — Telehealth: Payer: Self-pay | Admitting: Family Medicine

## 2023-06-27 NOTE — Telephone Encounter (Signed)
Copied from CRM [id number]. Topic: Clinical - Medication Question >> Jun 27, 2023  1:15 PM Fredrich Romans wrote: Reason for CRM: patient would like to know if she could have a refill on medication   sertraline (ZOLOFT) 50 MG tablet she had her cpe with provider on 12/2/205,and forgot to mention to her that she needed refills on this medication.It was prescribed by another provider that she no longer sees. She was wondering if it could be represcribed by Dr Russella Dar?

## 2023-06-28 NOTE — Telephone Encounter (Signed)
Yes ok to fill for patient.

## 2023-06-29 MED ORDER — SERTRALINE HCL 50 MG PO TABS: 50.0000 mg | ORAL_TABLET | Freq: Every day | ORAL | 3 refills | Status: DC

## 2023-06-29 NOTE — Addendum Note (Signed)
Addended by: Johnella Moloney on: 06/29/2023 08:12 AM   Modules accepted: Orders

## 2023-06-29 NOTE — Telephone Encounter (Signed)
Rx done and the patient was informed. ?

## 2023-07-28 ENCOUNTER — Encounter: Payer: Self-pay | Admitting: Internal Medicine

## 2023-08-08 ENCOUNTER — Encounter: Payer: Self-pay | Admitting: Internal Medicine

## 2023-08-08 ENCOUNTER — Ambulatory Visit: Payer: BC Managed Care – PPO | Admitting: Internal Medicine

## 2023-08-08 VITALS — BP 130/79 | HR 76 | Temp 98.6°F | Resp 13 | Ht 67.0 in | Wt 188.0 lb

## 2023-08-08 DIAGNOSIS — D122 Benign neoplasm of ascending colon: Secondary | ICD-10-CM | POA: Diagnosis not present

## 2023-08-08 DIAGNOSIS — K648 Other hemorrhoids: Secondary | ICD-10-CM

## 2023-08-08 DIAGNOSIS — Z1211 Encounter for screening for malignant neoplasm of colon: Secondary | ICD-10-CM

## 2023-08-08 MED ORDER — FLEET ENEMA RE ENEM
1.0000 | ENEMA | Freq: Once | RECTAL | Status: AC
  Administered 2023-08-08: 1 via RECTAL

## 2023-08-08 MED ORDER — SODIUM CHLORIDE 0.9 % IV SOLN: 500.0000 mL | Freq: Once | INTRAVENOUS | Status: DC

## 2023-08-08 NOTE — Progress Notes (Signed)
 Called to room to assist during endoscopic procedure.  Patient ID and intended procedure confirmed with present staff. Received instructions for my participation in the procedure from the performing physician.

## 2023-08-08 NOTE — Progress Notes (Unsigned)
To pacu, VSS.report to Rn.tb 

## 2023-08-08 NOTE — Patient Instructions (Signed)
-  Handout on polyps, hemorrhoids provided -await pathology results -repeat colonoscopy for surveillance recommended. Date to be determined when pathology result become available   -Continue present medications   YOU HAD AN ENDOSCOPIC PROCEDURE TODAY AT THE [facility]:   Refer to the procedure report that was given to you for any specific questions about what was found during the examination.  If the procedure report does not answer your questions, please call your gastroenterologist to clarify.  If you requested that your care partner not be given the details of your procedure findings, then the procedure report has been included in a sealed envelope for you to review at your convenience later.  YOU SHOULD EXPECT: Some feelings of bloating in the abdomen. Passage of more gas than usual.  Walking can help get rid of the air that was put into your GI tract during the procedure and reduce the bloating. If you had a lower endoscopy (such as a colonoscopy or flexible sigmoidoscopy) you may notice spotting of blood in your stool or on the toilet paper. If you underwent a bowel prep for your procedure, you may not have a normal bowel movement for a few days.  Please Note:  You might notice some irritation and congestion in your nose or some drainage.  This is from the oxygen used during your procedure.  There is no need for concern and it should clear up in a day or so.  SYMPTOMS TO REPORT IMMEDIATELY:  Following lower endoscopy (colonoscopy or flexible sigmoidoscopy):  Excessive amounts of blood in the stool  Significant tenderness or worsening of abdominal pains  Swelling of the abdomen that is new, acute  Fever of 100F or higher   For urgent or emergent issues, a gastroenterologist can be reached at any hour by calling [phone]. Do not use MyChart messaging for urgent concerns.    DIET:  We do recommend a small meal at first, but then you may proceed to your regular diet.   Drink plenty of fluids but you should avoid alcoholic beverages for 24 hours.  ACTIVITY:  You should plan to take it easy for the rest of today and you should NOT DRIVE or use heavy machinery until tomorrow (because of the sedation medicines used during the test).    FOLLOW UP: Our staff will call the number listed on your records the next business day following your procedure.  We will call around 7:15- 8:00 am to check on you and address any questions or concerns that you may have regarding the information given to you following your procedure. If we do not reach you, we will leave a message.     If any biopsies were taken you will be contacted by phone or by letter within the next 1-3 weeks.  Please call us at [phone] if you have not heard about the biopsies in 3 weeks.    SIGNATURES/CONFIDENTIALITY: You and/or your care partner have signed paperwork which will be entered into your electronic medical record.  These signatures attest to the fact that that the information above on your After Visit Summary has been reviewed and is understood.  Full responsibility of the confidentiality of this discharge information lies with you and/or your care-partner.

## 2023-08-08 NOTE — Op Note (Signed)
 [facility] Patient Name: Diamond Hansen Procedure Date: 08/08/2023 1:03 PM MRN: [id number] Endoscopist: Beverley Fiedler , MD, [id number] Age: 48 Referring MD:  Date of Birth: 23-Feb-1976 Gender: Female Account #: [id number] Procedure:                Colonoscopy Indications:              Screening for colorectal malignant neoplasm, This                            is the patient's first colonoscopy Medicines:                Monitored Anesthesia Care Procedure:                Pre-Anesthesia Assessment:                           - Prior to the procedure, a History and Physical                            was performed, and patient medications and                            allergies were reviewed. The patient's tolerance of                            previous anesthesia was also reviewed. The risks                            and benefits of the procedure and the sedation                            options and risks were discussed with the patient.                            All questions were answered, and informed consent                            was obtained. Prior Anticoagulants: The patient has                            taken no anticoagulant or antiplatelet agents. ASA                            Grade Assessment: II - A patient with mild systemic                            disease. After reviewing the risks and benefits,                            the patient was deemed in satisfactory condition to                            undergo the procedure.  After obtaining informed consent, the colonoscope                            was passed under direct vision. Throughout the                            procedure, the patient's blood pressure, pulse, and                            oxygen saturations were monitored continuously. The                            Olympus Scope SN: [id number] was introduced through                            the anus and advanced to the  terminal ileum. The                            colonoscopy was performed without difficulty. The                            patient tolerated the procedure well. The quality                            of the bowel preparation was good. The ileocecal                            valve, appendiceal orifice, and rectum were                            photographed. Scope In: 1:32:20 PM Scope Out: 1:43:21 PM Scope Withdrawal Time: 0 hours 9 minutes 20 seconds  Total Procedure Duration: 0 hours 11 minutes 1 second  Findings:                 The digital rectal exam was normal.                           The terminal ileum appeared normal.                           A 2 mm polyp was found in the ascending colon. The                            polyp was sessile. The polyp was removed with a                            cold biopsy forceps. Resection and retrieval were                            complete.                           Internal hemorrhoids were found during  retroflexion. The hemorrhoids were small.                           The exam was otherwise without abnormality. Complications:            No immediate complications. Estimated Blood Loss:     Estimated blood loss: none. Impression:               - The examined portion of the ileum was normal.                           - One 2 mm polyp in the ascending colon, removed                            with a cold biopsy forceps. Resected and retrieved.                           - Small internal hemorrhoids.                           - The examination was otherwise normal. Recommendation:           - Patient has a contact number available for                            emergencies. The signs and symptoms of potential                            delayed complications were discussed with the                            patient. Return to normal activities tomorrow.                            Written discharge instructions  were provided to the                            patient.                           - Resume previous diet.                           - Continue present medications.                           - Await pathology results.                           - Repeat colonoscopy is recommended. The                            colonoscopy date will be determined after pathology                            results from today's exam become available for  review. Beverley Fiedler, MD 08/08/2023 1:46:28 PM This report has been signed electronically.

## 2023-08-08 NOTE — Progress Notes (Unsigned)
 GASTROENTEROLOGY PROCEDURE H&P NOTE   Primary Care Physician: Karie Georges, MD    Reason for Procedure:   Colon cancer screening  Plan:    colonoscopy  Patient is appropriate for endoscopic procedure(s) in the ambulatory ([facility]) setting.  The nature of the procedure, as well as the risks, benefits, and alternatives were carefully and thoroughly reviewed with the patient. Ample time for discussion and questions allowed. The patient understood, was satisfied, and agreed to proceed.     HPI: Diamond Hansen is a 48 y.o. female who presents for colonoscopy.  Medical history as below.  Tolerated the prep.  No recent chest pain or shortness of breath.  No abdominal pain today.  Past Medical History:  Diagnosis Date   Anemia    Anxiety    GERD (gastroesophageal reflux disease)     Past Surgical History:  Procedure Laterality Date   BREAST LUMPECTOMY WITH RADIOACTIVE SEED LOCALIZATION Left 03/21/2019   Procedure: LEFT BREAST LUMPECTOMY WITH BRACKETED RADIOACTIVE SEED LOCALIZATION;  Surgeon: Abigail Miyamoto, MD;  Location: [facility] OR;  Service: General;  Laterality: Left;   FOOT SURGERY Left 09/26/2019   TUBAL LIGATION     UMBILICAL HERNIA REPAIR     age 89    Prior to Admission medications   Medication Sig Start Date End Date Taking? Authorizing Provider  Ferrous Sulfate (IRON PO) Take 1 tablet by mouth as needed.   Yes [provider]  ammonium lactate (AMLACTIN) 12 % cream Apply 1 Application topically as needed for dry skin. 02/15/23   Vivi Barrack, DPM  hydrochlorothiazide (HYDRODIURIL) 25 MG tablet Take 1 tablet (25 mg total) by mouth daily. 03/03/23   Karie Georges, MD  meloxicam (MOBIC) 7.5 MG tablet Take 1 tablet (7.5 mg total) by mouth daily. 05/09/23   Karie Georges, MD  sertraline (ZOLOFT) 50 MG tablet TAKE 1 TABLET(50 MG) BY MOUTH DAILY 02/10/21   Wynn Banker, MD  sertraline (ZOLOFT) 50 MG tablet Take 1 tablet (50 mg total) by mouth daily.  06/29/23   Karie Georges, MD    Current Outpatient Medications  Medication Sig Dispense Refill   Ferrous Sulfate (IRON PO) Take 1 tablet by mouth as needed.     ammonium lactate (AMLACTIN) 12 % cream Apply 1 Application topically as needed for dry skin. 385 g 0   hydrochlorothiazide (HYDRODIURIL) 25 MG tablet Take 1 tablet (25 mg total) by mouth daily. 90 tablet 1   meloxicam (MOBIC) 7.5 MG tablet Take 1 tablet (7.5 mg total) by mouth daily. 30 tablet 3   sertraline (ZOLOFT) 50 MG tablet TAKE 1 TABLET(50 MG) BY MOUTH DAILY 90 tablet 1   sertraline (ZOLOFT) 50 MG tablet Take 1 tablet (50 mg total) by mouth daily. 30 tablet 3   Current Facility-Administered Medications  Medication Dose Route Frequency Provider Last Rate Last Admin   0.9 %  sodium chloride infusion  500 mL Intravenous Once Lajeana Strough, Carie Caddy, MD        Allergies as of 08/08/2023   (No Known Allergies)    Family History  Problem Relation Age of Onset   High blood pressure Mother    Hypertension Father    Cancer Father        unknown type   Diabetes Brother    Multiple sclerosis Paternal Uncle    Diabetes Maternal Grandmother    High blood pressure Maternal Grandmother    Diabetes Maternal Grandfather    High blood pressure Maternal Grandfather  Diabetes Paternal Grandmother    High blood pressure Paternal Grandmother    Diabetes Paternal Grandfather    High blood pressure Paternal Grandfather    Colon cancer Neg Hx    Esophageal cancer Neg Hx    Rectal cancer Neg Hx    Stomach cancer Neg Hx     Social History   Socioeconomic History   Marital status: Single    Spouse name: Not on file   Number of children: 3   Years of education: Not on file   Highest education level: Not on file  Occupational History   Not on file  Tobacco Use   Smoking status: Former   Smokeless tobacco: Never   Tobacco comments:    quit in 2011, smoke on and off for 8 years  Vaping Use   Vaping status: Some Days  Substance  and Sexual Activity   Alcohol use: Yes    Comment: occassionally   Drug use: No   Sexual activity: Not Currently    Partners: Male  Other Topics Concern   Not on file  Social History Narrative   Work or School: works at [employer] Situation: lives with 3 daughter (18, 66, 18)      Spiritual Beliefs: none      Lifestyle: walks a few days per week; diet is so so            Social Drivers of Corporate investment banker Strain: Not on file  Food Insecurity: No Food Insecurity (02/02/2022)   Received from [facility]   Hunger Vital Sign    Worried About Running Out of Food in the Last Year: Never true    Ran Out of Food in the Last Year: Never true  Transportation Needs: Not on file  Physical Activity: Not on file  Stress: Not on file  Social Connections: Unknown (01/19/2022)   [facility] from [facility]   Social Network    Social Network: Not on file  Intimate Partner Violence: Unknown (01/19/2022)   Received from [facility]   HITS    Physically Hurt: Not on file    Insult or Talk Down To: Not on file    Threaten Physical Harm: Not on file    Scream or Curse: Not on file    Physical Exam: Vital signs in last 24 hours: @BP  (!) 143/92   Pulse 68   Temp 98.6 F (37 C)   Ht 5\' 7"  (1.702 m)   Wt 188 lb (85.3 kg)   SpO2 100%   BMI 29.44 kg/m  GEN: NAD EYE: Sclerae anicteric ENT: MMM CV: Non-tachycardic Pulm: CTA b/l GI: Soft, NT/ND NEURO:  Alert & Oriented x 3   Erick Blinks, MD [facility]  08/08/2023 1:21 PM

## 2023-08-08 NOTE — Progress Notes (Unsigned)
 Pt's states no medical or surgical changes since previsit or office visit.

## 2023-08-09 ENCOUNTER — Telehealth: Payer: Self-pay

## 2023-08-09 NOTE — Telephone Encounter (Signed)
  Follow up Call-     08/08/2023   12:47 PM  Call back number  Post procedure Call Back phone  # [phone]  Permission to leave phone message Yes     Patient questions:  Do you have a fever, pain , or abdominal swelling? No. Pain Score  0 *  Have you tolerated food without any problems? Yes.    Have you been able to return to your normal activities? Yes.    Do you have any questions about your discharge instructions: Diet   No. Medications  No. Follow up visit  No.  Do you have questions or concerns about your Care? No.  Actions: * If pain score is 4 or above: No action needed, pain <4.

## 2023-08-11 ENCOUNTER — Encounter: Payer: Self-pay | Admitting: Internal Medicine

## 2023-08-11 LAB — SURGICAL PATHOLOGY

## 2023-10-13 ENCOUNTER — Encounter: Payer: Self-pay | Admitting: Family Medicine

## 2023-10-13 ENCOUNTER — Ambulatory Visit (INDEPENDENT_AMBULATORY_CARE_PROVIDER_SITE_OTHER): Admitting: Family Medicine

## 2023-10-13 DIAGNOSIS — F419 Anxiety disorder, unspecified: Secondary | ICD-10-CM

## 2023-10-13 DIAGNOSIS — I10 Essential (primary) hypertension: Secondary | ICD-10-CM | POA: Diagnosis not present

## 2023-10-13 MED ORDER — HYDROCHLOROTHIAZIDE 25 MG PO TABS: 25.0000 mg | ORAL_TABLET | Freq: Every day | ORAL | 1 refills | Status: AC

## 2023-10-13 MED ORDER — SERTRALINE HCL 100 MG PO TABS
150.0000 mg | ORAL_TABLET | Freq: Every day | ORAL | 1 refills | Status: AC
Start: 2023-10-13 — End: ?

## 2023-10-13 MED ORDER — HYDROXYZINE HCL 25 MG PO TABS: 25.0000 mg | ORAL_TABLET | Freq: Three times a day (TID) | ORAL | 0 refills | Status: AC | PRN

## 2023-10-13 NOTE — Assessment & Plan Note (Signed)
 BP is elevated however this is likely due to acute anxiety/ depression symptoms. Will continue HCTA 25 mg daily, needs refills.

## 2023-10-13 NOTE — Progress Notes (Signed)
 Established Patient Office Visit  Subjective   Patient ID: Diamond Hansen, female    DOB: [birth year]  Age: 48 y.o. MRN: [id number]  Chief Complaint  Patient presents with   Anxiety    Worse past 2 months, concerned with problems with her job, one daughter had difficulties with pregnancy and now oldest daughter tested positive for HIV, doubling dose of Sertraline  for the past few months with some relief, "mind racing", difficulty sleeping, tachycardia noted, requests leave to be excused from work    Pt is reporting an increase in stress and anxiety for the past 2 months. States that her daughters are having medical issues and she is worried about them. 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 States she is tired all the time and she is mentally tired as well. States she increased from 50 to 100 mg of sertraline  last month, states she feels a little bit better but is still having nervousness. No side effects from increasing the sertraline  to 100 mg daily. Also is having to move to another place because her landlord is selling her condo. States her boss is also criticizing her at work.     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      Objective:     BP (!) 140/80   Pulse 95   Temp 98.2 F (36.8 C) (Oral)   Ht 5\' 7"  (1.702 m)   Wt 192 lb 12.8 oz (87.5 kg)   LMP 09/24/2023   SpO2 97%   BMI 30.20 kg/m    Physical Exam Vitals reviewed.  Constitutional:      Appearance: Normal appearance. She is well-groomed and normal weight.  Cardiovascular:     Rate and Rhythm: Normal rate and regular rhythm.     Pulses: Normal pulses.     Heart sounds: S1 normal and S2 normal.  Pulmonary:     Effort: Pulmonary effort is normal.     Breath sounds: Normal breath sounds and air entry.  Musculoskeletal:     Right lower leg: No edema.     Left lower leg: No edema.  Neurological:     Mental Status: She is alert and oriented to person, place, and time. Mental status is at baseline.     Gait: Gait is intact.  Psychiatric:        Mood and Affect: Mood is anxious. Affect is tearful.        Speech: Speech normal.        Behavior: Behavior normal.        Judgment: Judgment normal.      No results found for any visits on 10/13/23.    The 10-year ASCVD risk score (Arnett DK, et al., 2019) is:  1.5%    Assessment & Plan:  Anxiety -     Sertraline  HCl; Take 1.5 tablets (150 mg total) by mouth daily. TAKE 1 TABLET(50 MG) BY MOUTH DAILY  Dispense: 135 tablet; Refill: 1 -     hydrOXYzine HCl; Take 1 tablet (25 mg total) by mouth every 8 (eight) hours as needed for anxiety (also for sleep).  Dispense: 90 tablet; Refill: 0  Primary hypertension Assessment & Plan: BP is elevated however this is likely due to acute anxiety/ depression symptoms. Will continue HCTA 25 mg daily, needs refills.   Orders: -     hydroCHLOROthiazide ; Take 1 tablet (25 mg total) by mouth daily.  Dispense: 90 tablet; Refill: 1    We discussed options including adding medication or sending to therapy, and pt wants to try increasing the sertraline  to 150 mg daily. I will see her in 1 month to reassess her symptoms.  Return in about 1 month (around  11/13/2023) for video visit for follow up depression.    Aida House, MD
